# Patient Record
Sex: Male | Born: 1935 | Race: White | Hispanic: No | Smoking: Former smoker
Health system: Southern US, Community
[De-identification: ages and names within clinical notes are randomized; demographics above are authoritative.]

## PROBLEM LIST (undated history)

## (undated) DIAGNOSIS — G2581 Restless legs syndrome: Secondary | ICD-10-CM

## (undated) DIAGNOSIS — R413 Other amnesia: Secondary | ICD-10-CM

## (undated) DIAGNOSIS — E538 Deficiency of other specified B group vitamins: Secondary | ICD-10-CM

## (undated) DIAGNOSIS — D692 Other nonthrombocytopenic purpura: Secondary | ICD-10-CM

## (undated) DIAGNOSIS — E785 Hyperlipidemia, unspecified: Secondary | ICD-10-CM

## (undated) DIAGNOSIS — F419 Anxiety disorder, unspecified: Secondary | ICD-10-CM

## (undated) DIAGNOSIS — I10 Essential (primary) hypertension: Secondary | ICD-10-CM

## (undated) DIAGNOSIS — M1990 Unspecified osteoarthritis, unspecified site: Secondary | ICD-10-CM

## (undated) DIAGNOSIS — N400 Enlarged prostate without lower urinary tract symptoms: Secondary | ICD-10-CM

## (undated) HISTORY — PX: PROSTATE BIOPSY: SHX241

## (undated) HISTORY — DX: Hyperlipidemia, unspecified: E78.5

## (undated) HISTORY — DX: Benign prostatic hyperplasia without lower urinary tract symptoms: N40.0

## (undated) HISTORY — DX: Essential (primary) hypertension: I10

## (undated) HISTORY — DX: Restless legs syndrome: G25.81

## (undated) HISTORY — DX: Other nonthrombocytopenic purpura: D69.2

## (undated) HISTORY — DX: Other amnesia: R41.3

## (undated) HISTORY — DX: Anxiety disorder, unspecified: F41.9

## (undated) HISTORY — DX: Unspecified osteoarthritis, unspecified site: M19.90

## (undated) HISTORY — DX: Deficiency of other specified B group vitamins: E53.8

---

## 2011-04-15 DIAGNOSIS — H35729 Serous detachment of retinal pigment epithelium, unspecified eye: Secondary | ICD-10-CM | POA: Diagnosis not present

## 2011-04-15 DIAGNOSIS — H26499 Other secondary cataract, unspecified eye: Secondary | ICD-10-CM | POA: Diagnosis not present

## 2011-04-15 DIAGNOSIS — H353190 Nonexudative age-related macular degeneration, unspecified eye, stage unspecified: Secondary | ICD-10-CM | POA: Diagnosis not present

## 2011-05-06 DIAGNOSIS — E785 Hyperlipidemia, unspecified: Secondary | ICD-10-CM | POA: Diagnosis not present

## 2011-05-06 DIAGNOSIS — G2581 Restless legs syndrome: Secondary | ICD-10-CM | POA: Diagnosis not present

## 2011-05-06 DIAGNOSIS — M1990 Unspecified osteoarthritis, unspecified site: Secondary | ICD-10-CM | POA: Diagnosis not present

## 2011-05-06 DIAGNOSIS — I10 Essential (primary) hypertension: Secondary | ICD-10-CM | POA: Diagnosis not present

## 2011-11-03 DIAGNOSIS — Z125 Encounter for screening for malignant neoplasm of prostate: Secondary | ICD-10-CM | POA: Diagnosis not present

## 2011-11-03 DIAGNOSIS — E785 Hyperlipidemia, unspecified: Secondary | ICD-10-CM | POA: Diagnosis not present

## 2011-11-03 DIAGNOSIS — I10 Essential (primary) hypertension: Secondary | ICD-10-CM | POA: Diagnosis not present

## 2011-11-11 DIAGNOSIS — E785 Hyperlipidemia, unspecified: Secondary | ICD-10-CM | POA: Diagnosis not present

## 2011-11-11 DIAGNOSIS — I10 Essential (primary) hypertension: Secondary | ICD-10-CM | POA: Diagnosis not present

## 2011-11-11 DIAGNOSIS — M1990 Unspecified osteoarthritis, unspecified site: Secondary | ICD-10-CM | POA: Diagnosis not present

## 2011-11-11 DIAGNOSIS — Z125 Encounter for screening for malignant neoplasm of prostate: Secondary | ICD-10-CM | POA: Diagnosis not present

## 2011-11-11 DIAGNOSIS — Z0000 Encounter for general adult medical examination without abnormal findings: Secondary | ICD-10-CM | POA: Diagnosis not present

## 2012-05-11 DIAGNOSIS — Z1331 Encounter for screening for depression: Secondary | ICD-10-CM | POA: Diagnosis not present

## 2012-05-11 DIAGNOSIS — N401 Enlarged prostate with lower urinary tract symptoms: Secondary | ICD-10-CM | POA: Diagnosis not present

## 2012-05-11 DIAGNOSIS — E785 Hyperlipidemia, unspecified: Secondary | ICD-10-CM | POA: Diagnosis not present

## 2012-05-11 DIAGNOSIS — I10 Essential (primary) hypertension: Secondary | ICD-10-CM | POA: Diagnosis not present

## 2012-06-08 DIAGNOSIS — L570 Actinic keratosis: Secondary | ICD-10-CM | POA: Diagnosis not present

## 2012-11-09 DIAGNOSIS — Z125 Encounter for screening for malignant neoplasm of prostate: Secondary | ICD-10-CM | POA: Diagnosis not present

## 2012-11-09 DIAGNOSIS — I10 Essential (primary) hypertension: Secondary | ICD-10-CM | POA: Diagnosis not present

## 2012-11-09 DIAGNOSIS — E785 Hyperlipidemia, unspecified: Secondary | ICD-10-CM | POA: Diagnosis not present

## 2012-11-25 DIAGNOSIS — Z961 Presence of intraocular lens: Secondary | ICD-10-CM | POA: Diagnosis not present

## 2012-11-25 DIAGNOSIS — H353190 Nonexudative age-related macular degeneration, unspecified eye, stage unspecified: Secondary | ICD-10-CM | POA: Diagnosis not present

## 2012-11-29 DIAGNOSIS — I10 Essential (primary) hypertension: Secondary | ICD-10-CM | POA: Diagnosis not present

## 2012-11-29 DIAGNOSIS — E785 Hyperlipidemia, unspecified: Secondary | ICD-10-CM | POA: Diagnosis not present

## 2012-11-29 DIAGNOSIS — Z125 Encounter for screening for malignant neoplasm of prostate: Secondary | ICD-10-CM | POA: Diagnosis not present

## 2012-11-29 DIAGNOSIS — M1990 Unspecified osteoarthritis, unspecified site: Secondary | ICD-10-CM | POA: Diagnosis not present

## 2012-11-29 DIAGNOSIS — Z0000 Encounter for general adult medical examination without abnormal findings: Secondary | ICD-10-CM | POA: Diagnosis not present

## 2013-05-31 DIAGNOSIS — E785 Hyperlipidemia, unspecified: Secondary | ICD-10-CM | POA: Diagnosis not present

## 2013-05-31 DIAGNOSIS — M1990 Unspecified osteoarthritis, unspecified site: Secondary | ICD-10-CM | POA: Diagnosis not present

## 2013-05-31 DIAGNOSIS — I10 Essential (primary) hypertension: Secondary | ICD-10-CM | POA: Diagnosis not present

## 2013-05-31 DIAGNOSIS — IMO0002 Reserved for concepts with insufficient information to code with codable children: Secondary | ICD-10-CM | POA: Diagnosis not present

## 2013-06-20 DIAGNOSIS — M79609 Pain in unspecified limb: Secondary | ICD-10-CM | POA: Diagnosis not present

## 2013-11-24 DIAGNOSIS — Z125 Encounter for screening for malignant neoplasm of prostate: Secondary | ICD-10-CM | POA: Diagnosis not present

## 2013-11-24 DIAGNOSIS — I10 Essential (primary) hypertension: Secondary | ICD-10-CM | POA: Diagnosis not present

## 2013-11-24 DIAGNOSIS — E785 Hyperlipidemia, unspecified: Secondary | ICD-10-CM | POA: Diagnosis not present

## 2013-12-01 DIAGNOSIS — I10 Essential (primary) hypertension: Secondary | ICD-10-CM | POA: Diagnosis not present

## 2013-12-01 DIAGNOSIS — IMO0002 Reserved for concepts with insufficient information to code with codable children: Secondary | ICD-10-CM | POA: Diagnosis not present

## 2013-12-01 DIAGNOSIS — Z0000 Encounter for general adult medical examination without abnormal findings: Secondary | ICD-10-CM | POA: Diagnosis not present

## 2013-12-01 DIAGNOSIS — Z1331 Encounter for screening for depression: Secondary | ICD-10-CM | POA: Diagnosis not present

## 2013-12-01 DIAGNOSIS — E785 Hyperlipidemia, unspecified: Secondary | ICD-10-CM | POA: Diagnosis not present

## 2013-12-01 DIAGNOSIS — Z125 Encounter for screening for malignant neoplasm of prostate: Secondary | ICD-10-CM | POA: Diagnosis not present

## 2013-12-01 DIAGNOSIS — N401 Enlarged prostate with lower urinary tract symptoms: Secondary | ICD-10-CM | POA: Diagnosis not present

## 2013-12-01 DIAGNOSIS — M1990 Unspecified osteoarthritis, unspecified site: Secondary | ICD-10-CM | POA: Diagnosis not present

## 2013-12-12 DIAGNOSIS — Z961 Presence of intraocular lens: Secondary | ICD-10-CM | POA: Diagnosis not present

## 2013-12-12 DIAGNOSIS — H3531 Nonexudative age-related macular degeneration: Secondary | ICD-10-CM | POA: Diagnosis not present

## 2015-06-17 ENCOUNTER — Other Ambulatory Visit: Payer: Self-pay | Admitting: Internal Medicine

## 2015-06-17 DIAGNOSIS — R413 Other amnesia: Secondary | ICD-10-CM

## 2015-06-24 ENCOUNTER — Ambulatory Visit
Admission: RE | Admit: 2015-06-24 | Discharge: 2015-06-24 | Disposition: A | Discharge status: Home / Self Care | Payer: Medicare Other | Source: Ambulatory Visit | Attending: Internal Medicine | Admitting: Internal Medicine

## 2015-06-24 DIAGNOSIS — R413 Other amnesia: Secondary | ICD-10-CM

## 2015-06-24 MED ORDER — GADOBENATE DIMEGLUMINE 529 MG/ML IV SOLN
15.0000 mL | Freq: Once | INTRAVENOUS | Status: AC | PRN
  Administered 2015-06-24: 15 mL via INTRAVENOUS

## 2016-06-17 ENCOUNTER — Ambulatory Visit: Payer: Medicare Other | Admitting: Neurology

## 2016-06-17 ENCOUNTER — Encounter: Payer: Self-pay | Admitting: *Deleted

## 2016-06-17 ENCOUNTER — Encounter: Payer: Self-pay | Admitting: Diagnostic Neuroimaging

## 2016-06-17 ENCOUNTER — Encounter (INDEPENDENT_AMBULATORY_CARE_PROVIDER_SITE_OTHER): Payer: Self-pay

## 2016-06-17 ENCOUNTER — Ambulatory Visit (INDEPENDENT_AMBULATORY_CARE_PROVIDER_SITE_OTHER): Payer: Medicare Other | Admitting: Diagnostic Neuroimaging

## 2016-06-17 VITALS — BP 151/63 | HR 67 | Ht 69.0 in | Wt 147.4 lb

## 2016-06-17 DIAGNOSIS — F0391 Unspecified dementia with behavioral disturbance: Secondary | ICD-10-CM

## 2016-06-17 DIAGNOSIS — F03B18 Unspecified dementia, moderate, with other behavioral disturbance: Secondary | ICD-10-CM

## 2016-06-17 NOTE — Patient Instructions (Signed)
-   patient has moderate dementia and severe hearing loss  - I recommend that patient does not live alone or drive a vehicle at this time  - remove weapons and firearms from the home  - options include assisted living facility vs in home caregivers  - once patient has improved safety and supervision, may consider SSRI, mirtazapine, anti-psychotic meds in future  - advised patient and son to review legal issues re: POA, competency, guardianship; patient does not appear capable of handling his own finances at this time (he immediately forgets conversations, accidentally tries to open new bank accounts, accusatory towards son and family)

## 2016-06-17 NOTE — Progress Notes (Signed)
[facility]  PATIENT: Johnny May DOB: Nov 26, 1923  REFERRING CLINICIAN: Deirdre May HISTORY FROM: patient and son REASON FOR VISIT: new consult    HISTORICAL  CHIEF COMPLAINT:  Chief Complaint  Patient presents with  . NP Johnny May    Pt very [place]  . Memory Loss    MMSE 18/30 aft 9/30sec    HISTORY OF PRESENT ILLNESS:   83 year old male here for evaluation of dementia.  Patient is extremely hard of hearing. He does acknowledge some mild memory problems but does not think these are significant. Patient is living alone, ever since his wife Johnny May (married for 15 years) passed away on 2016/04/04. Since that time patient son has noted that he is having issues with short-term memory loss, mood swings, loneliness, safety concerns. Apparently his wife was concerned about these issues before she passed away.  Now patient having significant progression of short-term memory problems. He called the police 3 weeks ago stating that someone had broken into the house, when there is no evidence of this. Patient continues to have problems with paranoia stating that "strange people are putting dresses into the closet". Patient having trouble asking the same questions about his bank accounts, keys, wallet. Last week patient son was out of town traveling and patient's sister who is 59 years old came to stay with him. At one point patient did not recognize his sister, became angry with her and threatened her. Patient having concerns about management of his finances and money, which he has delegated to his son. When they're together patient is calm and rational and agrees with the management. However at various times throughout the day or night he may get confused and called his son and accused him that his money is not being managed properly.  Last night patient called up his son late at night asking where his wife Johnny May had gone, and he did not remember that she had passed away just a few  months ago.  Patient son is trying to get in-home care as soon as possible and hopefully this will be set up within the next 1 week. He is also looking at options for assisted living placement.  Patient son is concerned about patient's ability to manage his accounts properly. Apparently patient has gone to the bank several times and tried to open new accounts.  Patient son is concerned about patient's ability to drive. Apparently patient is still driving, and one day recently tried to come home but ended up parking in the driveway of a neighbor 3 houses away.  Patient has several firearms at home which patient son is planning to remove and secure.  Patient having inverted schedule, getting very confused in the evening, staying up late at night, calling son multiple times during the nighttime.    REVIEW OF SYSTEMS: Full 14 system review of systems performed and negative with exception of: Fevers chills weight loss hearing loss memory loss confusion and slurred speech dizziness restless legs depression anxiety hallucinations not asleep.  ALLERGIES: Not on File  HOME MEDICATIONS: Outpatient Medications Prior to Visit  Medication Sig Dispense Refill  . aspirin EC 81 MG tablet Take 162 mg by mouth every other day.    . donepezil (ARICEPT) 5 MG tablet Take 5 mg by mouth at bedtime.     No facility-administered medications prior to visit.     PAST MEDICAL HISTORY: Past Medical History:  Diagnosis Date  . Anxiety   . BPH (benign prostatic hyperplasia)   .  Hyperlipidemia   . Hypertension   . Memory loss   . Osteoarthritis   . RLS (restless legs syndrome)   . Senile purpura ([place])   . Vitamin B12 deficiency     PAST SURGICAL HISTORY: Past Surgical History:  Procedure Laterality Date  . PROSTATE BIOPSY      FAMILY HISTORY: Family History  Problem Relation Age of Onset  . Heart disease Mother   . Heart disease Father     SOCIAL HISTORY:  Social History   Social History    . Marital status: Married    Spouse name: N/A  . Number of children: N/A  . Years of education: N/A   Occupational History  . Not on file.   Social History Main Topics  . Smoking status: Former Smoker    Packs/day: 1.00    Quit date: 03/09/1966  . Smokeless tobacco: Never Used     Comment: 50 years ago quit  . Alcohol use No  . Drug use: No  . Sexual activity: Not on file   Other Topics Concern  . Not on file   Social History Narrative   Pt lives at home alone. Widow as of 2018.  One son. Retired.     Caffeine 1 drink daily.  Is driving.       PHYSICAL EXAM  GENERAL EXAM/CONSTITUTIONAL: Vitals:  Vitals:   06/17/16 1517  BP: (!) 151/63  Pulse: 67  Weight: 147 lb 6.4 oz (66.9 kg)  Height: 5\' 9"  (1.753 m)     Body mass index is 21.77 kg/m.  No exam data present  Patient is in no distress; well developed, nourished and groomed; neck is supple  CARDIOVASCULAR:  Examination of carotid arteries is normal; no carotid bruits  Regular rate and rhythm, no murmurs  Examination of peripheral vascular system by observation and palpation is normal  EYES:  Ophthalmoscopic exam of optic discs and posterior segments is normal; no papilledema or hemorrhages  MUSCULOSKELETAL:  Gait, strength, tone, movements noted in Neurologic exam below  NEUROLOGIC: MENTAL STATUS:  MMSE - Mini Mental State Exam 06/17/2016  Orientation to time 0  Orientation to Place 3  Registration 3  Attention/ Calculation 4  Recall 1  Language- name 2 objects 2  Language- repeat 1  Language- follow 3 step command 2  Language- read & follow direction 1  Write a sentence 1  Copy design 0  Total score 18    awake, alert, oriented to person, place and time  [place]; comprehension intact, naming intact,   fund of knowledge appropriate  DECREASED INSIGHT  CRANIAL NERVE:   2nd - no papilledema on fundoscopic exam  2nd, 3rd, 4th,  6th - pupils equal and reactive to light, visual fields full to confrontation, extraocular muscles intact, no nystagmus  5th - facial sensation symmetric  7th - facial strength symmetric  8th - hearing --> MOD - SEVERE HEARING LOSS    9th - palate elevates symmetrically, uvula midline  11th - shoulder shrug symmetric  12th - tongue protrusion midline  MOTOR:   normal bulk and tone, full strength in the BUE, BLE  SENSORY:   normal and symmetric to light touch  COORDINATION:   finger-nose-finger, fine finger movements normal  REFLEXES:   deep tendon reflexes TRACE and symmetric  GAIT/STATION:   narrow based gait    DIAGNOSTIC DATA (LABS, IMAGING, TESTING) - I reviewed patient records, labs, notes, testing and imaging myself  where available.  No results found for: WBC, HGB, HCT, MCV, PLT No results found for: NA, K, CL, CO2, GLUCOSE, BUN, CREATININE, CALCIUM, PROT, ALBUMIN, AST, ALT, ALKPHOS, BILITOT, GFRNONAA, GFRAA No results found for: CHOL, HDL, LDLCALC, LDLDIRECT, TRIG, CHOLHDL No results found for: HGBA1C No results found for: VITAMINB12 No results found for: TSH   06/24/15 MRI brain [I reviewed images myself and agree with interpretation. -VRP]  1. No acute intracranial abnormality. 2. Largely unremarkable for age MRI appearance of the brain; mild nonspecific cerebral white matter changes which are most commonly due to chronic small vessel disease.     ASSESSMENT AND PLAN  83 y.o. year old male here with progressive short-term memory loss, confusion, navigation difficulty, poor judgment and insight, agitation, confusion, hallucinations, paranoid thoughts, worsening over the last 3 months, but possibly gradually progressing for at least one year. Signs and symptoms are most consistent with moderate - severe neurodegenerative dementia.  Dx:  1. Moderate dementia with behavioral disturbance      PLAN:  I spent 60 minutes of face to face time with  patient. Greater than 50% of time was spent in counseling and coordination of care with patient. In summary we discussed:    - patient has moderate -severe dementia and severe hearing loss  - I recommend that patient does not live alone or drive at this time (son agrees)  - son must remove weapons and firearms from the home immediately (son agrees)  - options include assisted living facility vs in home caregivers  - once patient has improved safety and supervision, may consider SSRI, mirtazapine, anti-psychotic meds in future for sleep and behavior issues  - advised patient and son to review legal issues re: POA, competency, guardianship; patient does not appear capable of handling his own finances at this time (he immediately forgets conversations, accidentally tries to open new bank accounts, accusatory towards son and family)  Return in about 3 months (around 09/16/2016).    Penni Bombard, MD 04/20/9415, 4:08 PM Certified in Neurology, Neurophysiology and Neuroimaging  [facility] [address] [place], [place] [address] [phone]

## 2016-06-18 DIAGNOSIS — F0391 Unspecified dementia with behavioral disturbance: Secondary | ICD-10-CM | POA: Insufficient documentation

## 2016-06-18 DIAGNOSIS — F03B18 Unspecified dementia, moderate, with other behavioral disturbance: Secondary | ICD-10-CM | POA: Insufficient documentation

## 2016-06-24 ENCOUNTER — Telehealth: Payer: Self-pay | Admitting: Diagnostic Neuroimaging

## 2016-06-24 NOTE — Telephone Encounter (Signed)
Pt son called to advise Dr Leta Baptist of increasing concerns for his father. Pt son said his father's behavior is increasingly troubling and very alarming. He is angry and insist on living alone and refusing in home care assistance. Pt blacked out in church and still insisted on driving home. Pt Son said Dr Leta Baptist recommended a physc evaluation, pt son would like to move forward with that suggestion and would like a call with a referral.  Pt son also said that he is getting some paperwork from the [facility] for his father and will need it filled out by Dr Leta Baptist. Pt son would like to be called at [phone].

## 2016-06-24 NOTE — Telephone Encounter (Signed)
I called patient's son x 2; no answer. -VRP

## 2016-06-25 NOTE — Telephone Encounter (Signed)
Patient son Johnny May called office returning call to Dr. Leta Baptist please contact Johnny May at [phone] and please leave a voice mail per Johnny May if he does not answer the call.

## 2016-06-29 ENCOUNTER — Encounter: Payer: Self-pay | Admitting: *Deleted

## 2016-06-29 ENCOUNTER — Telehealth: Payer: Self-pay | Admitting: Diagnostic Neuroimaging

## 2016-06-29 NOTE — Telephone Encounter (Signed)
Spoke to son, [place], [place].  Stated that pt will be going to [facility] on [address].  Needs letter for [facility], attention Desiree Hane faxed to [phone].  He is to come by with POA and also sign release of information for [place] to release medical information to [facility].  Placed up front in file.

## 2016-06-29 NOTE — Telephone Encounter (Signed)
LMVM for pts son, relating to letter for [facility] ( pt not to drive).

## 2016-06-29 NOTE — Telephone Encounter (Signed)
Patients son Kasandra Knudsen called office requesting a letter be written for him to bring to the [facility] stating patient is unable to drive.  Please call Danny to confirm where letter will need to be sent to. Please send to Desiree Hane email: [email or web address].  Please contact Danny prior to sending letter.

## 2016-06-29 NOTE — Telephone Encounter (Signed)
Patients son returning RN's call.  Please call

## 2016-06-30 NOTE — Telephone Encounter (Addendum)
LMVM for son of pt, who is POA.  Dr. Leta Baptist does not do FL-2 forms defers to pcp.  I have not received fax but if do will forward to pcp, Dr. [name].  On medication for anxiety, will hold off until pt at facility and see how he adjusts.

## 2016-06-30 NOTE — Telephone Encounter (Signed)
Son called in to inform that he is planning to have pt moved into [facility] on [place] of this week (26th).  Mel Almond @ [address] is sending a fax to [place] with the [facility] form, son is asking this to be filled out asap.  Pt son also mentioned that Dr Leta Baptist mentioned he would not want to put pt on anxiety medication until he is in 24 hour care.  Pt son said with the move he'd like to know if Dr Leta Baptist would get with staff to coordinate something.  Son can be reached at [phone]

## 2016-07-01 ENCOUNTER — Emergency Department (HOSPITAL_COMMUNITY)
Admission: EM | Admit: 2016-07-01 | Discharge: 2016-07-03 | Disposition: A | Discharge status: Home / Self Care | Payer: Medicare Other | Attending: Emergency Medicine | Admitting: Emergency Medicine

## 2016-07-01 ENCOUNTER — Encounter (HOSPITAL_COMMUNITY): Payer: Self-pay | Admitting: Emergency Medicine

## 2016-07-01 DIAGNOSIS — Z7982 Long term (current) use of aspirin: Secondary | ICD-10-CM | POA: Diagnosis not present

## 2016-07-01 DIAGNOSIS — F03B18 Unspecified dementia, moderate, with other behavioral disturbance: Secondary | ICD-10-CM

## 2016-07-01 DIAGNOSIS — R4689 Other symptoms and signs involving appearance and behavior: Secondary | ICD-10-CM

## 2016-07-01 DIAGNOSIS — Z79899 Other long term (current) drug therapy: Secondary | ICD-10-CM | POA: Diagnosis not present

## 2016-07-01 DIAGNOSIS — Z87891 Personal history of nicotine dependence: Secondary | ICD-10-CM | POA: Diagnosis not present

## 2016-07-01 DIAGNOSIS — F0391 Unspecified dementia with behavioral disturbance: Secondary | ICD-10-CM | POA: Diagnosis not present

## 2016-07-01 DIAGNOSIS — Z01818 Encounter for other preprocedural examination: Secondary | ICD-10-CM

## 2016-07-01 DIAGNOSIS — I10 Essential (primary) hypertension: Secondary | ICD-10-CM | POA: Insufficient documentation

## 2016-07-01 DIAGNOSIS — F918 Other conduct disorders: Secondary | ICD-10-CM | POA: Diagnosis present

## 2016-07-01 NOTE — ED Triage Notes (Signed)
Pt brought in by police voluntarily for medical clearance  Pt is currently staying with his sister until they can get him in an assisted living facility on Friday   Family states pt got aggressive and was threatening his family earlier tonight  Pt has dementia

## 2016-07-01 NOTE — ED Provider Notes (Signed)
[facility] DEPT Provider Note   CSN: [id number] Arrival date & time: 07/01/16  2259   By signing my name below, I, Eunice Blase, attest that this documentation has been prepared under the direction and in the presence of [employer], PA-C. Electronically signed, Eunice Blase, ED Scribe. 07/01/16. 12:19 AM.   History   Chief Complaint Chief Complaint  Patient presents with  . Medical Clearance   LEVEL 5 CAVEAT: HPI and ROS limited due to dementia   The history is provided by the patient and medical records. The history is limited by the condition of the patient. No language interpreter was used.    Johnny May is a 83 y.o. male with h/o dementia, BIB [employer] presents to the Emergency Department with concern for the pt's safety. Pt reportedly lives alone, but has been staying with his sister while transitioning into assisted living. His son has attempted to transition him to assisted living with increasing resistance recently. Pt reportedly has become more agitated. He has a known hx of dementia. Pt not on any medications for dementia. Son states the pt has become belligerent and threatening to his sister, with whom he has been living as the transition is finalized for the pt to be moved to assisted living. [facility]vers are prepared to move the pt to assisted living facility on 07/03/2016, per pt's son. Pain noted to the pt's R foot. He has taken aspirin for this with temporary relief.  Pt's son has power of attorney and is the pt's caregiver: [phone]   Past Medical History:  Diagnosis Date  . Anxiety   . BPH (benign prostatic hyperplasia)   . Hyperlipidemia   . Hypertension   . Memory loss   . Osteoarthritis   . RLS (restless legs syndrome)   . Senile purpura ([place])   . Vitamin B12 deficiency     Patient Active Problem List   Diagnosis Date Noted  . Moderate dementia with behavioral disturbance 06/18/2016    Past Surgical History:  Procedure Laterality Date  . PROSTATE  BIOPSY         Home Medications    Prior to Admission medications   Medication Sig Start Date End Date Taking? Authorizing Provider  aspirin EC 81 MG tablet Take 162 mg by mouth every other day.   Yes Historical Provider, MD  donepezil (ARICEPT) 5 MG tablet Take 5 mg by mouth at bedtime.   Yes Historical Provider, MD    Family History Family History  Problem Relation Age of Onset  . Heart disease Mother   . Heart disease Father     Social History Social History  Substance Use Topics  . Smoking status: Former Smoker    Packs/day: 1.00    Quit date: 03/09/1966  . Smokeless tobacco: Never Used     Comment: 50 years ago quit  . Alcohol use No     Allergies   Patient has no known allergies.   Review of Systems Review of Systems  Unable to perform ROS: Dementia     Physical Exam Updated Vital Signs BP (!) 153/103 (BP Location: Right Arm)   Pulse 79   Temp 97.5 F (36.4 C) (Oral)   Resp 18   SpO2 100%   Physical Exam  Constitutional: He is oriented to person, place, and time. He appears well-developed and well-nourished. No distress.  Nontoxic and in NAD  HENT:  Head: Normocephalic and atraumatic.  Eyes: Conjunctivae and EOM are normal. No scleral icterus.  Neck: Normal range  of motion.  Pulmonary/Chest: Effort normal. No respiratory distress.  Respirations even and unlabored  Musculoskeletal: Normal range of motion.  Neurological: He is alert and oriented to person, place, and time. He exhibits normal muscle tone. Coordination normal.  GCS 15. Patient answers questions appropriately and follows commands. Moving all extremities; ambulatory.  Skin: Skin is warm and dry. No rash noted. He is not diaphoretic. No erythema. No pallor.  Psychiatric: He has a normal mood and affect. His behavior is normal.  Nursing note and vitals reviewed.    ED Treatments / Results  DIAGNOSTIC STUDIES: Oxygen Saturation is 100% on RA, NL by my interpretation.     COORDINATION OF CARE: 12:11 AM-Discussed next steps with pt. Pt verbalized understanding and is agreeable with the plan. Will order psychiatric evaluation.   Labs (all labs ordered are listed, but only abnormal results are displayed) Labs Reviewed  COMPREHENSIVE METABOLIC PANEL - Abnormal; Notable for the following:       Result Value   Potassium 3.3 (*)    Calcium 8.7 (*)    ALT 13 (*)    All other components within normal limits  CBC - Abnormal; Notable for the following:    RBC 3.71 (*)    Hemoglobin 11.4 (*)    HCT 34.6 (*)    All other components within normal limits  ETHANOL  SALICYLATE LEVEL  ACETAMINOPHEN LEVEL  RAPID URINE DRUG SCREEN, HOSP PERFORMED    EKG  EKG Interpretation None       Radiology No results found.  Procedures Procedures (including critical care time)  Medications Ordered in ED Medications  zolpidem (AMBIEN) tablet 5 mg (5 mg Oral Given 07/02/16 0340)     Initial Impression / Assessment and Plan / ED Course  I have reviewed the triage vital signs and the nursing notes.  Pertinent labs & imaging results that were available during my care of the patient were reviewed by me and considered in my medical decision making (see chart for details).     Patient medically cleared. He has been evaluated by TTS who recommend psychiatric evaluation in the morning. Disposition to be determined by oncoming ED provider.   Final Clinical Impressions(s) / ED Diagnoses   Final diagnoses:  Aggressive behavior  Dementia with behavioral disturbance, unspecified dementia type    New Prescriptions New Prescriptions   No medications on file    I personally performed the services described in this documentation, which was scribed in my presence. The recorded information has been reviewed and is accurate.      Antonietta Breach, PA-C 07/02/16 6073    April Palumbo, MD 07/02/16 [phone]

## 2016-07-02 ENCOUNTER — Emergency Department (HOSPITAL_COMMUNITY): Payer: Medicare Other

## 2016-07-02 DIAGNOSIS — Z87891 Personal history of nicotine dependence: Secondary | ICD-10-CM

## 2016-07-02 DIAGNOSIS — F0391 Unspecified dementia with behavioral disturbance: Secondary | ICD-10-CM | POA: Diagnosis not present

## 2016-07-02 LAB — URINALYSIS, ROUTINE W REFLEX MICROSCOPIC
BACTERIA UA: NONE SEEN
BILIRUBIN URINE: NEGATIVE
Glucose, UA: NEGATIVE mg/dL
KETONES UR: NEGATIVE mg/dL
LEUKOCYTES UA: NEGATIVE
NITRITE: NEGATIVE
PH: 6 (ref 5.0–8.0)
Protein, ur: NEGATIVE mg/dL
Specific Gravity, Urine: 1.009 (ref 1.005–1.030)
Squamous Epithelial / LPF: NONE SEEN
WBC UA: NONE SEEN WBC/hpf (ref 0–5)

## 2016-07-02 LAB — COMPREHENSIVE METABOLIC PANEL
ALBUMIN: 3.7 g/dL (ref 3.5–5.0)
ALK PHOS: 54 U/L (ref 38–126)
ALT: 13 U/L — ABNORMAL LOW (ref 17–63)
AST: 20 U/L (ref 15–41)
Anion gap: 7 (ref 5–15)
BUN: 16 mg/dL (ref 6–20)
CHLORIDE: 105 mmol/L (ref 101–111)
CO2: 28 mmol/L (ref 22–32)
Calcium: 8.7 mg/dL — ABNORMAL LOW (ref 8.9–10.3)
Creatinine, Ser: 1.05 mg/dL (ref 0.61–1.24)
GFR calc Af Amer: >60 mL/min (ref >60)
GFR calc non Af Amer: >60 mL/min (ref >60)
GLUCOSE: 96 mg/dL (ref 65–99)
POTASSIUM: 3.3 mmol/L — AB (ref 3.5–5.1)
SODIUM: 140 mmol/L (ref 135–145)
Total Bilirubin: 0.4 mg/dL (ref 0.3–1.2)
Total Protein: 6.8 g/dL (ref 6.5–8.1)

## 2016-07-02 LAB — CBC
HEMATOCRIT: 34.6 % — AB (ref 39.0–52.0)
HEMOGLOBIN: 11.4 g/dL — AB (ref 13.0–17.0)
MCH: 30.7 pg (ref 26.0–34.0)
MCHC: 32.9 g/dL (ref 30.0–36.0)
MCV: 93.3 fL (ref 78.0–100.0)
Platelets: 239 10*3/uL (ref 150–400)
RBC: 3.71 MIL/uL — AB (ref 4.22–5.81)
RDW: 14.1 % (ref 11.5–15.5)
WBC: 9 10*3/uL (ref 4.0–10.5)

## 2016-07-02 LAB — SALICYLATE LEVEL: Salicylate Lvl: <7 mg/dL (ref 2.8–30.0)

## 2016-07-02 LAB — ACETAMINOPHEN LEVEL: ACETAMINOPHEN (TYLENOL), SERUM: 12 ug/mL (ref 10–30)

## 2016-07-02 LAB — ETHANOL: Alcohol, Ethyl (B): <5 mg/dL (ref <5)

## 2016-07-02 IMAGING — CR DG CHEST 2V
2 series · 2 of 2 positions shown · non-contrast
Comparison: None.

CLINICAL DATA: Encounter for pre-admission testing

EXAM:
CHEST  2 VIEW

[w chest pa]
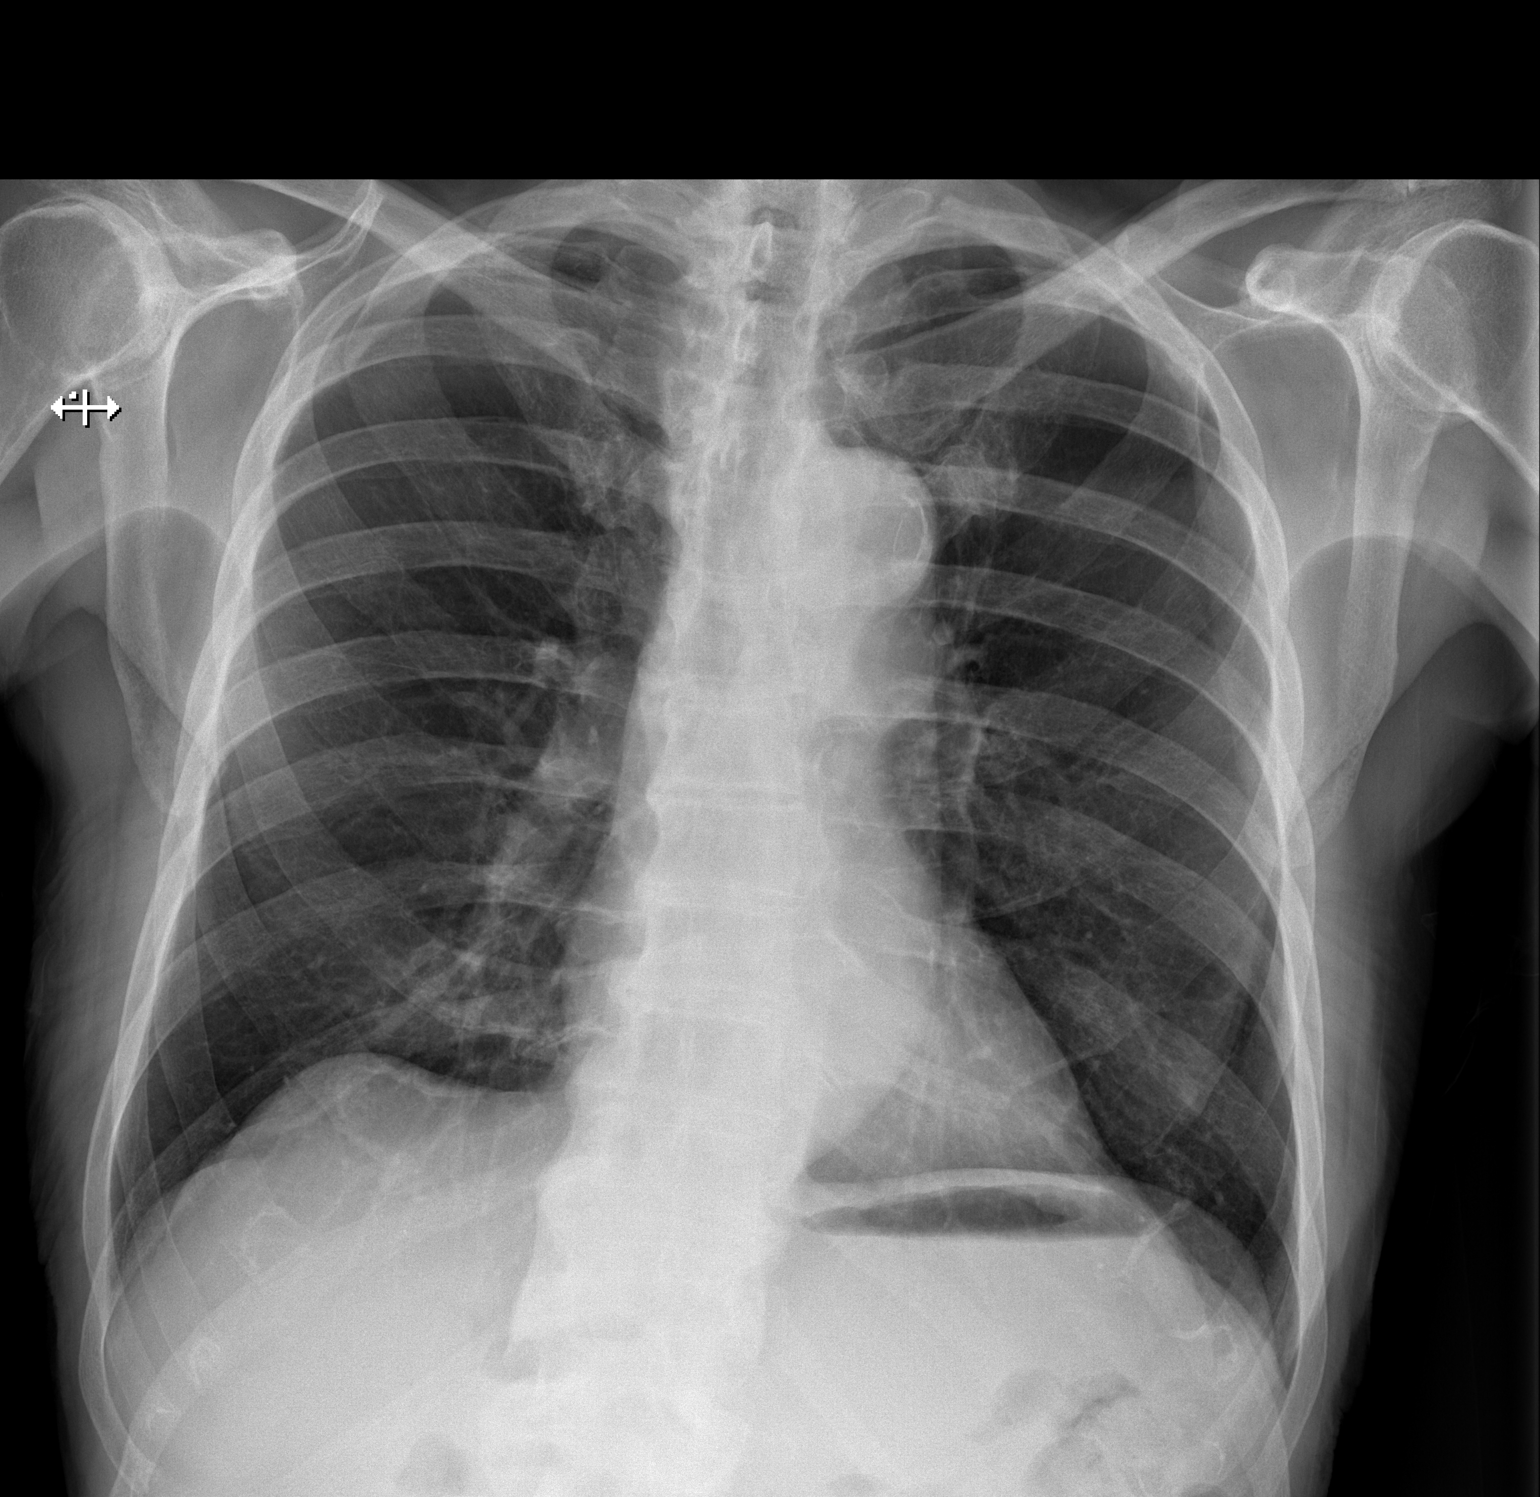

[w chest lat]
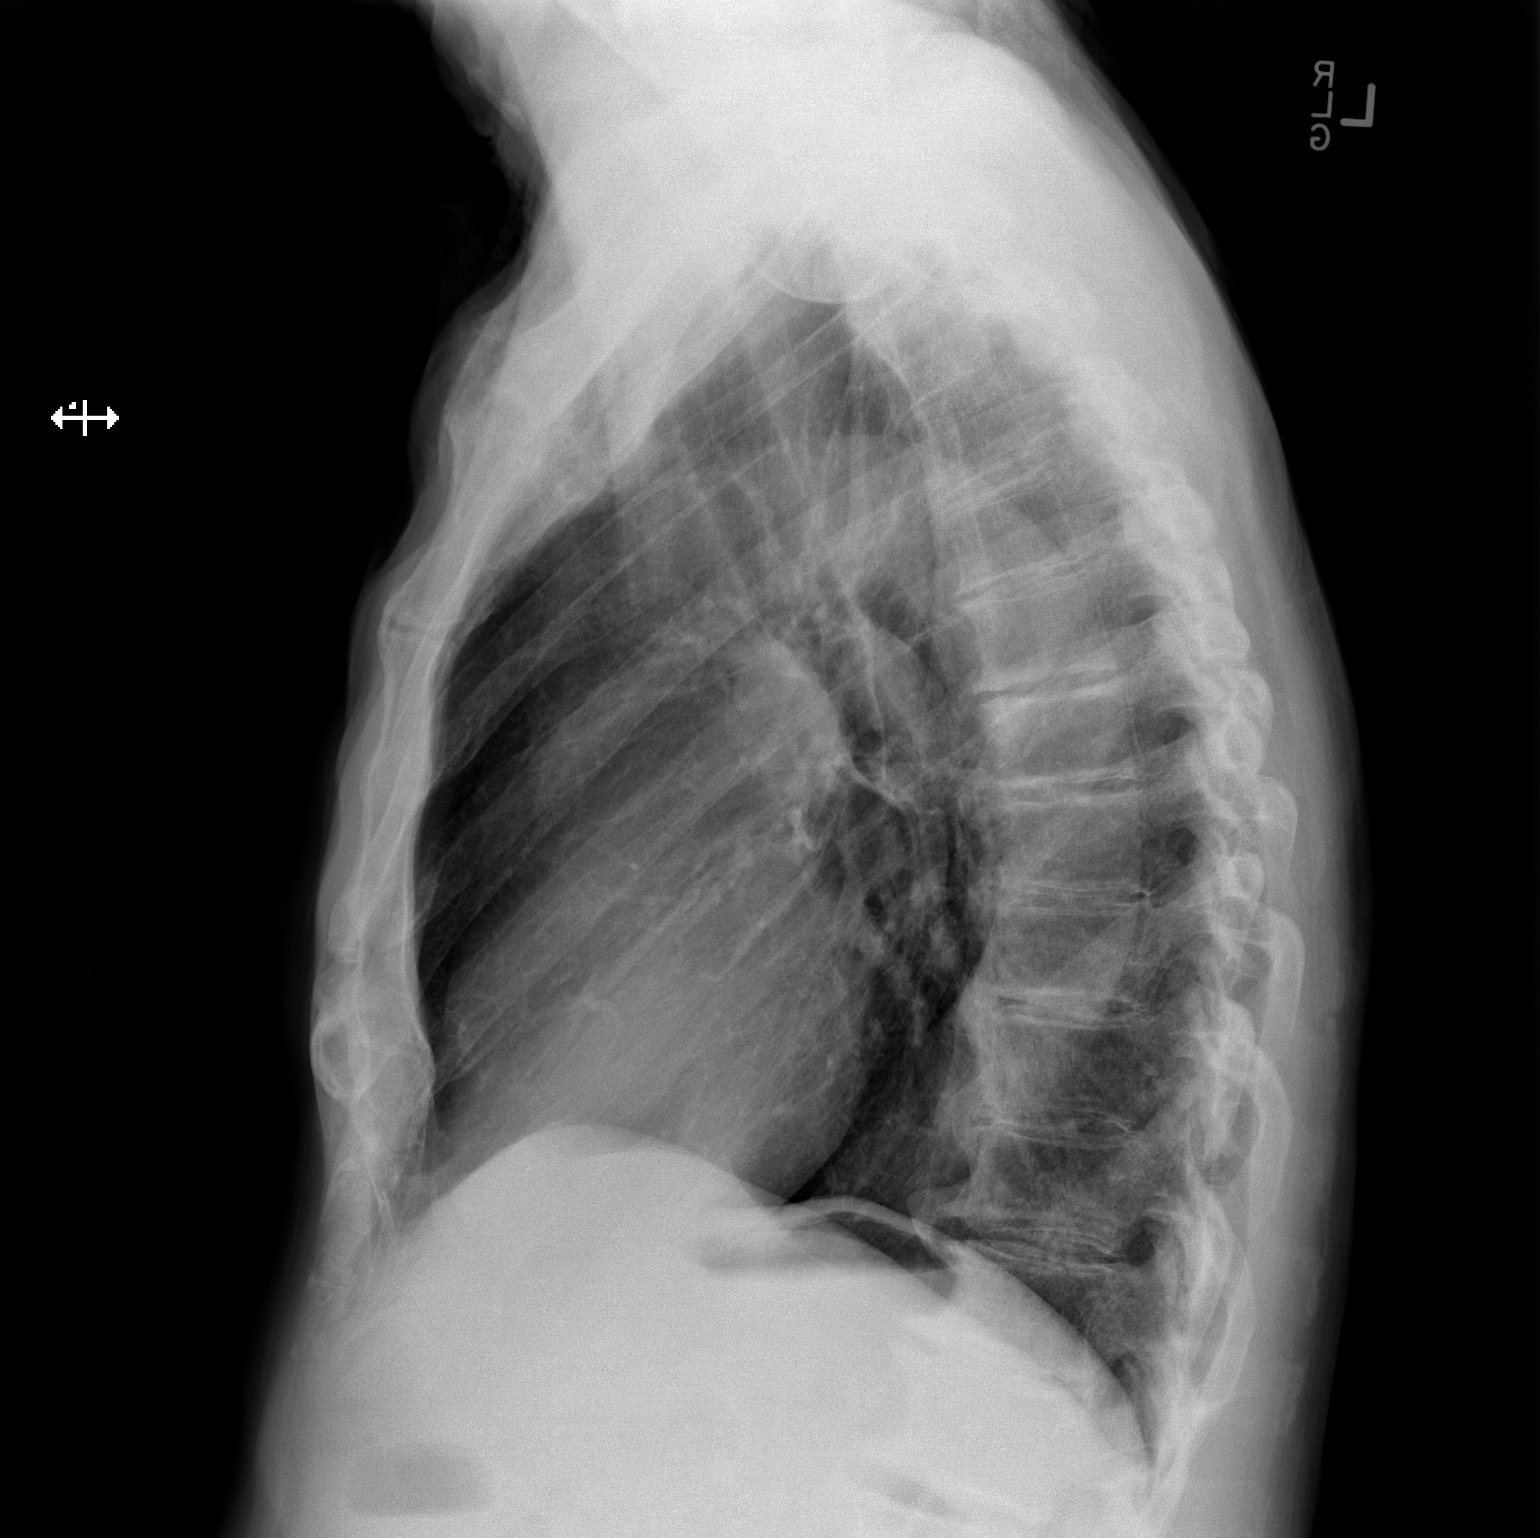

[2 of 2 positions shown; findings below may reference images not displayed]

FINDINGS: Heart and mediastinal contours are within normal limits. No focal
opacities or effusions. No acute bony abnormality.
IMPRESSION: No active cardiopulmonary disease.

## 2016-07-02 MED ORDER — QUETIAPINE FUMARATE 50 MG PO TABS
50.0000 mg | ORAL_TABLET | Freq: Every day | ORAL | Status: DC
  Administered 2016-07-02: 50 mg via ORAL
  Filled 2016-07-02: qty 1

## 2016-07-02 MED ORDER — DOXYCYCLINE HYCLATE 100 MG PO TABS
100.0000 mg | ORAL_TABLET | Freq: Two times a day (BID) | ORAL | Status: DC
  Administered 2016-07-02 – 2016-07-03 (×3): 100 mg via ORAL
  Filled 2016-07-02 (×3): qty 1

## 2016-07-02 MED ORDER — ASENAPINE MALEATE 5 MG SL SUBL
5.0000 mg | SUBLINGUAL_TABLET | Freq: Once | SUBLINGUAL | Status: AC
  Administered 2016-07-02: 5 mg via SUBLINGUAL
  Filled 2016-07-02: qty 1

## 2016-07-02 MED ORDER — ASPIRIN EC 81 MG PO TBEC
162.0000 mg | DELAYED_RELEASE_TABLET | ORAL | Status: DC
  Administered 2016-07-02: 162 mg via ORAL
  Filled 2016-07-02: qty 2

## 2016-07-02 MED ORDER — POTASSIUM CHLORIDE CRYS ER 20 MEQ PO TBCR
40.0000 meq | EXTENDED_RELEASE_TABLET | Freq: Once | ORAL | Status: AC
  Administered 2016-07-02: 40 meq via ORAL
  Filled 2016-07-02: qty 2

## 2016-07-02 MED ORDER — CITALOPRAM HYDROBROMIDE 10 MG PO TABS
10.0000 mg | ORAL_TABLET | Freq: Every day | ORAL | Status: DC
  Administered 2016-07-02 – 2016-07-03 (×2): 10 mg via ORAL
  Filled 2016-07-02 (×2): qty 1

## 2016-07-02 MED ORDER — DONEPEZIL HCL 5 MG PO TABS
5.0000 mg | ORAL_TABLET | Freq: Every day | ORAL | Status: DC
  Administered 2016-07-02: 5 mg via ORAL
  Filled 2016-07-02: qty 1

## 2016-07-02 MED ORDER — ZOLPIDEM TARTRATE 5 MG PO TABS
5.0000 mg | ORAL_TABLET | Freq: Every evening | ORAL | Status: DC | PRN
  Administered 2016-07-02: 5 mg via ORAL
  Filled 2016-07-02: qty 1

## 2016-07-02 NOTE — ED Notes (Signed)
Pt became agitated, requested his belongings and cell phone to call for a ride home. This RN reiterated that he will speak to a psychiatrist in the AM and would he like some med to help him sleep. Pt replyed "Yes". PA informed of need for sleep aid.

## 2016-07-02 NOTE — BH Assessment (Addendum)
Tele Assessment Note   Johnny May is an 83 y.o. male, who presents voluntary and unaccompanied to [facility]. Pt reported, "the little boy cried like a baby because I was mean to her." Pt reported, "I was mean to the little girl." Pt reported, the little girl was his 11 year-old, great niece. Pt reported, "I don't know what to do about the little girl." Pt reported, she concocted a story." Pt reported, "the law brought me to [facility]." Pt reported, "I wish I had my car, the lady doctor said I can leave in the morning." Pt denied SI, HI, AVH, and self-injurious behaviors.   Per EDP/NP/PA note: "Pt presents to the Emergency Department with concern for the pt's safety. Pt reportedly lives alone ut has been staying with his sister while transitioning into assisted living. His son has attempted to transition him to assisted living with increasing resistance recently. Pt reportedly has become more agitated since dementia diagnosis. Pt not on any medications for dementia. Son states the pt has become belligerent and threatening to his sister, with whom he has been living as the transition is finalized for the pt to be moved to assisted living. Pain noted to the pt's R foot. He has taken aspirin for this with temporary relief. [facility] are prepared to move the pt to assisted living facility on 07/03/2016, per pt's son."   Clinician was unable to assess history of abuse, previous inpatient admissions. Pt denied substance use. Per chart, pt's UDS is pending. Pt denies being linked to OPT resources (medication management and/or counseling).   Pt presented alert in scrubs, with loud logical/coherent speech. Pt's eye contact was good. Pt's mood was pleasant. Pt's affect was appropriate to circumstance. Pt's thought process was coherent/relevant. Pt's judgement was unimpaired. Pt's concentration was normal. Pt's insight was fair. Pt's impulse control was poor. Pt was oriented x4 (day, year, city and state).   Diagnosis:  Deferred  Past Medical History:  Past Medical History:  Diagnosis Date  . Anxiety   . BPH (benign prostatic hyperplasia)   . Hyperlipidemia   . Hypertension   . Memory loss   . Osteoarthritis   . RLS (restless legs syndrome)   . Senile purpura ([place])   . Vitamin B12 deficiency     Past Surgical History:  Procedure Laterality Date  . PROSTATE BIOPSY      Family History:  Family History  Problem Relation Age of Onset  . Heart disease Mother   . Heart disease Father     Social History:  reports that he quit smoking about 50 years ago. He smoked 1.00 pack per day. He has never used smokeless tobacco. He reports that he does not drink alcohol or use drugs.  Additional Social History:  Alcohol / Drug Use Pain Medications: See MAR Prescriptions: See MAR Over the Counter: See MAR  CIWA: CIWA-Ar BP: (!) 153/103 Pulse Rate: 79 COWS:    PATIENT STRENGTHS: (choose at least two) Average or above average intelligence Supportive family/friends  Allergies: No Known Allergies  Home Medications:  (Not in a hospital admission)  OB/GYN Status:  No LMP for male patient.  General Assessment Data Location of Assessment: [facility] TTS Assessment: In system Is this a Tele or Face-to-Face Assessment?: Face-to-Face Is this an Initial Assessment or a Re-assessment for this encounter?: Initial Assessment Marital status: Widowed Living Arrangements: Other (Comment) (Per chart, with sister. ) Can pt return to current living arrangement?:  (UTA) Admission Status: Voluntary Is patient capable of signing  voluntary admission?: Yes Referral Source: Self/Family/Friend Insurance type: [place] Living Arrangements: Other (Comment) (Per chart, with sister. ) Legal Guardian: Other: (Self) Name of Psychiatrist: Na Name of Therapist: NA  Education Status Is patient currently in school?: No Current Grade: NA Highest grade of school patient has completed: High School Name of  school: NA Contact person: NA  Risk to self with the past 6 months Suicidal Ideation: No (Pt denies. ) Has patient been a risk to self within the past 6 months prior to admission? : No Suicidal Intent: No Has patient had any suicidal intent within the past 6 months prior to admission? : No Is patient at risk for suicide?: No Suicidal Plan?: No Has patient had any suicidal plan within the past 6 months prior to admission? : No Access to Means: No What has been your use of drugs/alcohol within the last 12 months?: Pending Previous Attempts/Gestures: No How many times?: 0 Other Self Harm Risks: Pt denies. Triggers for Past Attempts: None known Intentional Self Injurious Behavior: None (Pt denies. ) Family Suicide History: Unable to assess Recent stressful life event(s): Other (Comment) (UTA) Persecutory voices/beliefs?: No Depression: No Substance abuse history and/or treatment for substance abuse?: No Suicide prevention information given to non-admitted patients: Not applicable  Risk to Others within the past 6 months Homicidal Ideation: No (Pt denies. ) Does patient have any lifetime risk of violence toward others beyond the six months prior to admission? : No Thoughts of Harm to Others: No Current Homicidal Intent: No Current Homicidal Plan: No Access to Homicidal Means: No Identified Victim: NA History of harm to others?: No Assessment of Violence: On admission Violent Behavior Description: Per chart, pt has became beligerent and threatening sister.  Does patient have access to weapons?: No (shot gun for hunting) Criminal Charges Pending?: No Does patient have a court date: No Is patient on probation?: No  Psychosis Hallucinations: None noted Delusions: None noted  Mental Status Report Appearance/Hygiene: In scrubs Eye Contact: Good Motor Activity: Unremarkable Speech: Logical/coherent Level of Consciousness: Alert Mood: Pleasant Affect: Appropriate to  circumstance Anxiety Level: None Thought Processes: Coherent, Relevant Judgement: Unimpaired Orientation: Other (Comment) (day, year, city and state.) Obsessive Compulsive Thoughts/Behaviors: None  Cognitive Functioning Concentration: Normal Memory: Recent Intact IQ: Average Insight: Fair Impulse Control: Poor Appetite: Good Weight Loss: 0 Weight Gain: 0 Sleep: No Change Total Hours of Sleep:  (Pt reported, good. ) Vegetative Symptoms: None  ADLScreening [facility] Assessment Services) Patient's cognitive ability adequate to safely complete daily activities?: Yes Patient able to express need for assistance with ADLs?: Yes Independently performs ADLs?: Yes (appropriate for developmental age)  Prior Inpatient Therapy Prior Inpatient Therapy: No Prior Therapy Dates: NA Prior Therapy Facilty/Provider(s): NA Reason for Treatment: NA  Prior Outpatient Therapy Prior Outpatient Therapy: No Prior Therapy Dates: NA Prior Therapy Facilty/Provider(s): NA Reason for Treatment: NA Does patient have an ACCT team?: No Does patient have Intensive In-House Services?  : No Does patient have [facility] services? : No Does patient have P4CC services?: No  ADL Screening (condition at time of admission) Patient's cognitive ability adequate to safely complete daily activities?: Yes Is the patient deaf or have difficulty hearing?: Yes (Pt wears a hearing aid. ) Does the patient have difficulty seeing, even when wearing glasses/contacts?: Yes (Pt wears glasses. ) Does the patient have difficulty concentrating, remembering, or making decisions?: No Patient able to express need for assistance with ADLs?: Yes Does the patient have difficulty dressing  or bathing?:  (UTA) Independently performs ADLs?: Yes (appropriate for developmental age) Does the patient have difficulty walking or climbing stairs?: No Weakness of Legs: None Weakness of Arms/Hands: None       Abuse/Neglect Assessment (Assessment to  be complete while patient is alone) Physical Abuse:  (UTA) Verbal Abuse:  (UTA) Sexual Abuse:  (UTA) Exploitation of patient/patient's resources:  (UTA) Self-Neglect:  (UTA)     Advance Directives (For Healthcare) Does Patient Have a Medical Advance Directive?: No Does patient want to make changes to medical advance directive?: No - Patient declined Type of Advance Directive: [place] in Chart?: No - copy requested    Additional Information 1:1 In Past 12 Months?: No CIRT Risk: No Elopement Risk: No Does patient have medical clearance?: No     Disposition: Patriciaann Clan, PA recommends AM Psychiatric Evaluation. Disposition discussed with Claiborne Billings, [place] and Margaretha Sheffield, RN.  Disposition Initial Assessment Completed for this Encounter: Yes Disposition of Patient: Other dispositions (AM Psychiatric Evaluation) Other disposition(s): Other (Comment) (AM Psychiatric Evaluation)  Edd Fabian 07/02/2016 3:42 AM   Edd Fabian, MS, [facility], [facility] Triage Specialist [phone]

## 2016-07-02 NOTE — ED Notes (Signed)
Patient reported wound on right heel to nurse.  Patient reports that area is throbbing.  MD notified and a telfa and gauze dressing applied until wound nurse could see patient.  Yellow exudate present on wound surface.

## 2016-07-02 NOTE — ED Provider Notes (Signed)
I assumed care of this patient at sign out this morning. On my assessment, patient is pleasant and cooperative but he has been combative with staff. I was notified by RN of mildly erythematous ulcer to right achilles area. I suspect this is chronic wound 2/2 local pressure from shoes, but will place on doxy to prevent superinfection. He has no leukocytosis, fever, or evidence of sepsis and I do not suspect behavioral changes are due to this localized, superficial skin irritation. Will check plain films for eval of underlying bony abnormality/trauma and re-assess.   Duffy Bruce, MD 07/02/16 1524

## 2016-07-02 NOTE — ED Notes (Signed)
Patient resting in bed now.  Took po medication without difficulty.

## 2016-07-02 NOTE — ED Notes (Signed)
Pt is A&O x 4.  Pt speaks loudly.  Pt stated "my batteries in my hearing aids, I think, are dead."

## 2016-07-02 NOTE — ED Notes (Signed)
ED Provider at bedside. 

## 2016-07-02 NOTE — ED Notes (Signed)
Patient transported to X-ray 

## 2016-07-02 NOTE — ED Notes (Signed)
Patient became suddenly agitated.  Came out of room saying he wanted to leave and being very loud.  Patient redirected back to room by security and NP notified.

## 2016-07-02 NOTE — Consult Note (Signed)
[place] Psychiatry Consult   Reason for Consult: psychiatric evaluation Referring Physician:  EDP Patient Identification: Johnny May MRN:  [id number] Principal Diagnosis: Moderate dementia with behavioral disturbance Diagnosis:   Patient Active Problem List   Diagnosis Date Noted  . Moderate dementia with behavioral disturbance [F03.91] 06/18/2016    Priority: High    Total Time spent with patient: 45 minutes  Subjective:   Johnny May is a 83 y.o. male patient admitted with aggressive behavior.  HPI:  Patient with history of Dementia and multiple medical problem. He was brought to [facility] for evaluation due to increased aggression and agitation. Patient currently lives alone but he is in process of transitioning into  [facility] assited living facility. History was obtained from patient and his son who is his power of attorney. Son states the patient  has become belligerent and resisting moving into [facility]. Today, patient is calm, cooperative and pleasant. He denies suicidal/homicidal ideation, delusion, psychosis and says he is now agreeable to be transferred to [facility] assisted living facility.   Past Psychiatric History: as above  Risk to Self: Suicidal Ideation: No (Pt denies. ) Suicidal Intent: No Is patient at risk for suicide?: No Suicidal Plan?: No Access to Means: No What has been your use of drugs/alcohol within the last 12 months?: Pending How many times?: 0 Other Self Harm Risks: Pt denies. Triggers for Past Attempts: None known Intentional Self Injurious Behavior: None (Pt denies. ) Risk to Others: Homicidal Ideation: No (Pt denies. ) Thoughts of Harm to Others: No Current Homicidal Intent: No Current Homicidal Plan: No Access to Homicidal Means: No Identified Victim: NA History of harm to others?: No Assessment of Violence: On admission Violent Behavior Description: Per chart, pt has became beligerent and threatening sister.  Does patient have access to  weapons?: No (shot gun for hunting) Criminal Charges Pending?: No Does patient have a court date: No Prior Inpatient Therapy: Prior Inpatient Therapy:  (UTA) Prior Therapy Dates: UTA Prior Therapy Facilty/Provider(s): UTA Reason for Treatment: UTA Prior Outpatient Therapy: Prior Outpatient Therapy: No Prior Therapy Dates: NA Prior Therapy Facilty/Provider(s): NA Reason for Treatment: NA Does patient have an ACCT team?: No Does patient have Intensive In-House Services?  : No Does patient have [facility] services? : No Does patient have P4CC services?: No  Past Medical History:  Past Medical History:  Diagnosis Date  . Anxiety   . BPH (benign prostatic hyperplasia)   . Hyperlipidemia   . Hypertension   . Memory loss   . Osteoarthritis   . RLS (restless legs syndrome)   . Senile purpura ([place])   . Vitamin B12 deficiency     Past Surgical History:  Procedure Laterality Date  . PROSTATE BIOPSY     Family History:  Family History  Problem Relation Age of Onset  . Heart disease Mother   . Heart disease Father    Family Psychiatric  History:  Social History:  History  Alcohol Use No     History  Drug Use No    Social History   Social History  . Marital status: Married    Spouse name: N/A  . Number of children: N/A  . Years of education: N/A   Social History Main Topics  . Smoking status: Former Smoker    Packs/day: 1.00    Quit date: 03/09/1966  . Smokeless tobacco: Never Used     Comment: 50 years ago quit  . Alcohol use No  . Drug use: No  . Sexual  activity: Not Asked   Other Topics Concern  . None   Social History Narrative   Pt lives at home alone. Widow as of 2018.  One son. Retired.     Caffeine 1 drink daily.  Is driving.  Education HS.   2 cups coffee daily.   Additional Social History:    Allergies:  No Known Allergies  Labs:  Results for orders placed or performed during the hospital encounter of 07/01/16 (from the past 48 hour(s))   Comprehensive metabolic panel     Status: Abnormal   Collection Time: 07/01/16 11:42 PM  Result Value Ref Range   Sodium 140 135 - 145 mmol/L   Potassium 3.3 (L) 3.5 - 5.1 mmol/L   Chloride 105 101 - 111 mmol/L   CO2 28 22 - 32 mmol/L   Glucose, Bld 96 65 - 99 mg/dL   BUN 16 6 - 20 mg/dL   Creatinine, Ser 1.05 0.61 - 1.24 mg/dL   Calcium 8.7 (L) 8.9 - 10.3 mg/dL   Total Protein 6.8 6.5 - 8.1 g/dL   Albumin 3.7 3.5 - 5.0 g/dL   AST 20 15 - 41 U/L   ALT 13 (L) 17 - 63 U/L   Alkaline Phosphatase 54 38 - 126 U/L   Total Bilirubin 0.4 0.3 - 1.2 mg/dL   GFR calc non Af Amer >60 >60 mL/min   GFR calc Af Amer >60 >60 mL/min    Comment: (NOTE) The eGFR has been calculated using the CKD EPI equation. This calculation has not been validated in all clinical situations. eGFR's persistently <60 mL/min signify possible Chronic Kidney Disease.    Anion gap 7 5 - 15  Ethanol     Status: None   Collection Time: 07/01/16 11:42 PM  Result Value Ref Range   Alcohol, Ethyl (B) <5 <5 mg/dL    Comment:        LOWEST DETECTABLE LIMIT FOR SERUM ALCOHOL IS 5 mg/dL FOR MEDICAL PURPOSES ONLY   Salicylate level     Status: None   Collection Time: 07/01/16 11:42 PM  Result Value Ref Range   Salicylate Lvl <8.6 2.8 - 30.0 mg/dL  Acetaminophen level     Status: None   Collection Time: 07/01/16 11:42 PM  Result Value Ref Range   Acetaminophen (Tylenol), Serum 12 10 - 30 ug/mL    Comment:        THERAPEUTIC CONCENTRATIONS VARY SIGNIFICANTLY. A RANGE OF 10-30 ug/mL MAY BE AN EFFECTIVE CONCENTRATION FOR MANY PATIENTS. HOWEVER, SOME ARE BEST TREATED AT CONCENTRATIONS OUTSIDE THIS RANGE. ACETAMINOPHEN CONCENTRATIONS >150 ug/mL AT 4 HOURS AFTER INGESTION AND >50 ug/mL AT 12 HOURS AFTER INGESTION ARE OFTEN ASSOCIATED WITH TOXIC REACTIONS.   cbc     Status: Abnormal   Collection Time: 07/01/16 11:42 PM  Result Value Ref Range   WBC 9.0 4.0 - 10.5 K/uL   RBC 3.71 (L) 4.22 - 5.81 MIL/uL    Hemoglobin 11.4 (L) 13.0 - 17.0 g/dL   HCT 34.6 (L) 39.0 - 52.0 %   MCV 93.3 78.0 - 100.0 fL   MCH 30.7 26.0 - 34.0 pg   MCHC 32.9 30.0 - 36.0 g/dL   RDW 14.1 11.5 - 15.5 %   Platelets 239 150 - 400 K/uL  Urinalysis, Routine w reflex microscopic     Status: Abnormal   Collection Time: 07/02/16  9:00 AM  Result Value Ref Range   Color, Urine STRAW (A) YELLOW   APPearance CLEAR CLEAR  Specific Gravity, Urine 1.009 1.005 - 1.030   pH 6.0 5.0 - 8.0   Glucose, UA NEGATIVE NEGATIVE mg/dL   Hgb urine dipstick SMALL (A) NEGATIVE   Bilirubin Urine NEGATIVE NEGATIVE   Ketones, ur NEGATIVE NEGATIVE mg/dL   Protein, ur NEGATIVE NEGATIVE mg/dL   Nitrite NEGATIVE NEGATIVE   Leukocytes, UA NEGATIVE NEGATIVE   RBC / HPF 0-5 0 - 5 RBC/hpf   WBC, UA NONE SEEN 0 - 5 WBC/hpf   Bacteria, UA NONE SEEN NONE SEEN   Squamous Epithelial / LPF NONE SEEN NONE SEEN    Current Facility-Administered Medications  Medication Dose Route Frequency Provider Last Rate Last Dose  . aspirin EC tablet 162 mg  162 mg Oral Ernie Hew, PA-C   162 mg at 07/02/16 9767  . citalopram (CELEXA) tablet 10 mg  10 mg Oral Daily Remas Sobel, MD      . donepezil (ARICEPT) tablet 5 mg  5 mg Oral QHS Kelly Humes, PA-C      . QUEtiapine (SEROQUEL) tablet 50 mg  50 mg Oral QHS Corena Pilgrim, MD       Current Outpatient Prescriptions  Medication Sig Dispense Refill  . aspirin EC 81 MG tablet Take 162 mg by mouth every other day.    . donepezil (ARICEPT) 5 MG tablet Take 5 mg by mouth at bedtime.      Musculoskeletal: Strength & Muscle Tone: within normal limits Gait & Station: normal Patient leans: N/A  Psychiatric Specialty Exam: Physical Exam  ROS  Blood pressure (!) 153/103, pulse 79, temperature 97.5 F (36.4 C), temperature source Oral, resp. rate 18, SpO2 100 %.There is no height or weight on file to calculate BMI.  General Appearance: Casual  Eye Contact:  Good  Speech:  Clear and Coherent  Volume:   Normal  Mood:  Anxious  Affect:  Appropriate  Thought Process:  Coherent and Descriptions of Associations: Intact  Orientation:  Other:  only to person and place  Thought Content:  Logical  Suicidal Thoughts:  No  Homicidal Thoughts:  No  Memory:  Immediate;   Fair Recent;   Poor Remote;   Poor  Judgement:  Other:  marginal  Insight:  marginal  Psychomotor Activity:  Normal  Concentration:  Concentration: Fair and Attention Span: Fair  Recall:  [employer] of Knowledge:  Fair  Language:  Good  Akathisia:  No  Handed:  Right  AIMS (if indicated):     Assets:  Armed forces logistics/support/administrative officer Social Support  ADL's:  Impaired  Cognition:  Impaired,  Mild  Sleep:   fair     Treatment Plan Summary: Plan Patient is cleared by psychiatrist for discharge to Assited living facility  Continue Seroquel 50 mg qhs for sleep and Celexa 10 mg daily for aggressive behavior.  Disposition: No evidence of imminent risk to self or others at present.   Patient does not meet criteria for psychiatric inpatient admission. Patient can be discharged to his son for transfer to [facility] assisted living facility.  Corena Pilgrim, MD 07/02/2016 10:54 AM

## 2016-07-02 NOTE — ED Notes (Signed)
Patient visiting with son who brought in paper work for [employer].

## 2016-07-03 MED ORDER — CITALOPRAM HYDROBROMIDE 10 MG PO TABS: 10.0000 mg | ORAL_TABLET | Freq: Every day | ORAL | 0 refills | Status: DC

## 2016-07-03 MED ORDER — MUPIROCIN CALCIUM 2 % EX CREA: TOPICAL_CREAM | Freq: Every day | CUTANEOUS | 0 refills | Status: DC

## 2016-07-03 MED ORDER — DOXYCYCLINE HYCLATE 100 MG PO TABS: 100.0000 mg | ORAL_TABLET | Freq: Two times a day (BID) | ORAL | 0 refills | Status: DC

## 2016-07-03 MED ORDER — QUETIAPINE FUMARATE 50 MG PO TABS: 50.0000 mg | ORAL_TABLET | Freq: Every day | ORAL | 0 refills | Status: DC

## 2016-07-03 MED ORDER — MUPIROCIN CALCIUM 2 % EX CREA
TOPICAL_CREAM | Freq: Every day | CUTANEOUS | Status: DC
  Administered 2016-07-03: 11:00:00 via TOPICAL
  Filled 2016-07-03: qty 15

## 2016-07-03 NOTE — Consult Note (Addendum)
[place] Nurse wound consult note Reason for Consult: Consult requested for right achilles wound.  X-ray results state: "No radiographic evidence of osteomyelitis, Some soft tissue swelling is noted over the degenerative spur emanating from the insertion of the Achilles tendon."  WOC is currently at another campus and unable to assess the wound in person.  Pt is planning to leave this am and topical treatment orders are requested to facilitate discharge at this time.  Wound type: Full thickness According to the ER progress notes; "the physician was notified by RN of mildly erythematous ulcer to right achilles area. I suspect this is chronic wound 2/2 local pressure from shoes, but will place on doxy to prevent superinfection." Dressing procedure/placement/frequency: Bactroban to promote moist healing, gauze dressing to protect from further injury. Since wound is related to a bone spur, this will probably be a chronic condition which will be difficult to promote healing where shoes rub over the affected area. Discussed plan of care with bedside nurse in the ER over the phone. Please re-consult if further assistance is needed.  Thank-you,  Julien Girt MSN, [place], [place], [place], [place]

## 2016-07-03 NOTE — ED Notes (Signed)
Pen pad not working at this time. Pt son verbalized understanding of discharge medications and stated he had no further questions regarding discharge.

## 2016-07-03 NOTE — Progress Notes (Signed)
CSW spoke with [facility] and provided them with needed information for patients acceptance.   Kingsley Spittle, LCSWA Clinical Social Worker [phone]

## 2016-07-03 NOTE — BHH Suicide Risk Assessment (Signed)
Suicide Risk Assessment  Discharge Assessment   [facility] Discharge Suicide Risk Assessment   Principal Problem: Moderate dementia with behavioral disturbance Discharge Diagnoses:  Patient Active Problem List   Diagnosis Date Noted  . Moderate dementia with behavioral disturbance [F03.91] 06/18/2016    Total Time spent with patient: 45 minutes  Musculoskeletal: Strength & Muscle Tone: within normal limits Gait & Station: normal Patient leans: N/A  Psychiatric Specialty Exam: Physical Exam  ROS  Blood pressure (!) 153/103, pulse 79, temperature 97.5 F (36.4 C), temperature source Oral, resp. rate 18, SpO2 100 %.There is no height or weight on file to calculate BMI.  General Appearance: Casual  Eye Contact:  Good  Speech:  Clear and Coherent  Volume:  Normal  Mood:  Anxious  Affect:  Appropriate  Thought Process:  Coherent and Descriptions of Associations: Intact  Orientation:  Other:  only to person and place  Thought Content:  Logical  Suicidal Thoughts:  No  Homicidal Thoughts:  No  Memory:  Immediate;   Fair Recent;   Poor Remote;   Poor  Judgement:  Other:  marginal  Insight:  marginal  Psychomotor Activity:  Normal  Concentration:  Concentration: Fair and Attention Span: Fair  Recall:  [employer] of Knowledge:  Fair  Language:  Good  Akathisia:  No  Handed:  Right  AIMS (if indicated):     Assets:  Armed forces logistics/support/administrative officer Social Support  ADL's:  Impaired  Cognition:  Impaired,  Mild  Sleep:   fair    Mental Status Per Nursing Assessment::   On Admission:   aggression  Demographic Factors:  Male, Age 27 or older and Caucasian  Loss Factors: NA  Historical Factors: NA  Risk Reduction Factors:   Sense of responsibility to family and Positive social support  Continued Clinical Symptoms:  None  Cognitive Features That Contribute To Risk:  None    Suicide Risk:  Minimal: No identifiable suicidal ideation.  Patients presenting with no risk factors  but with morbid ruminations; may be classified as minimal risk based on the severity of the depressive symptoms    Plan Of Care/Follow-up recommendations:  Activity:  as tolerated Diet:  heart healthy diet  Johnny Roes, NP 07/03/2016, 10:30 AM

## 2016-07-03 NOTE — ED Notes (Signed)
Wound care orders were completed. Ointment and dressing in place.

## 2016-07-03 NOTE — NC FL2 (Signed)
  [place] LEVEL OF CARE SCREENING TOOL     IDENTIFICATION  Patient Name: Johnny May Birthdate: 1923-07-11 Sex: male Admission Date (Current Location): 07/01/2016  [facility] and [place] Number:  Herbalist and Address:  [facility],  [place] [address] [place]      Provider Number: [id number]  Attending Physician Name and Address:  Provider Default, MD  Relative Name and Phone Number:       Current Level of Care: Hospital Recommended Level of Care: [place] Prior Approval Number:    Date Approved/Denied:   PASRR Number:    Discharge Plan: Other (Comment) (ALF)    Current Diagnoses: Patient Active Problem List   Diagnosis Date Noted  . Moderate dementia with behavioral disturbance 06/18/2016    Orientation RESPIRATION BLADDER Height & Weight     Self, Situation  Normal Continent Weight: 147 lb (66.7 kg) Height:  5\' 9"  (175.3 cm)  BEHAVIORAL SYMPTOMS/MOOD NEUROLOGICAL BOWEL NUTRITION STATUS      Continent Diet (Regular)  AMBULATORY STATUS COMMUNICATION OF NEEDS Skin   Limited Assist Verbally Normal                       Personal Care Assistance Level of Assistance  Bathing, Feeding, Dressing Bathing Assistance: Limited assistance Feeding assistance: Independent Dressing Assistance: Limited assistance     Functional Limitations Info             SPECIAL CARE FACTORS FREQUENCY                       Contractures      Additional Factors Info  Code Status, Allergies Code Status Info: Full Code  Allergies Info: NKA            Current Medications (07/03/2016):  This is the current hospital active medication list Current Facility-Administered Medications  Medication Dose Route Frequency Provider Last Rate Last Dose  . aspirin EC tablet 162 mg  162 mg Oral Ernie Hew, PA-C   162 mg at 07/02/16 5009  . citalopram (CELEXA) tablet 10 mg  10 mg Oral Daily Corena Pilgrim, MD    10 mg at 07/03/16 0921  . donepezil (ARICEPT) tablet 5 mg  5 mg Oral QHS Antonietta Breach, PA-C   5 mg at 07/02/16 2111  . doxycycline (VIBRA-TABS) tablet 100 mg  100 mg Oral Q12H Duffy Bruce, MD   100 mg at 07/03/16 0921  . QUEtiapine (SEROQUEL) tablet 50 mg  50 mg Oral QHS Corena Pilgrim, MD   50 mg at 07/02/16 2112   Current Outpatient Prescriptions  Medication Sig Dispense Refill  . aspirin EC 81 MG tablet Take 162 mg by mouth every other day.    . donepezil (ARICEPT) 5 MG tablet Take 5 mg by mouth at bedtime.       Discharge Medications: Please see discharge summary for a list of discharge medications.  Relevant Imaging Results:  Relevant Lab Results:   Additional [place] with give B-12 shot   Weston Anna, [place]

## 2017-03-15 DIAGNOSIS — F419 Anxiety disorder, unspecified: Secondary | ICD-10-CM | POA: Diagnosis not present

## 2017-03-15 DIAGNOSIS — F0391 Unspecified dementia with behavioral disturbance: Secondary | ICD-10-CM | POA: Diagnosis not present

## 2017-03-15 DIAGNOSIS — F5102 Adjustment insomnia: Secondary | ICD-10-CM | POA: Diagnosis not present

## 2017-04-12 DIAGNOSIS — F39 Unspecified mood [affective] disorder: Secondary | ICD-10-CM | POA: Diagnosis not present

## 2017-04-12 DIAGNOSIS — F0391 Unspecified dementia with behavioral disturbance: Secondary | ICD-10-CM | POA: Diagnosis not present

## 2017-04-12 DIAGNOSIS — F419 Anxiety disorder, unspecified: Secondary | ICD-10-CM | POA: Diagnosis not present

## 2017-04-12 DIAGNOSIS — F5102 Adjustment insomnia: Secondary | ICD-10-CM | POA: Diagnosis not present

## 2017-05-10 DIAGNOSIS — F5102 Adjustment insomnia: Secondary | ICD-10-CM | POA: Diagnosis not present

## 2017-05-10 DIAGNOSIS — F39 Unspecified mood [affective] disorder: Secondary | ICD-10-CM | POA: Diagnosis not present

## 2017-05-10 DIAGNOSIS — F419 Anxiety disorder, unspecified: Secondary | ICD-10-CM | POA: Diagnosis not present

## 2017-05-10 DIAGNOSIS — F0391 Unspecified dementia with behavioral disturbance: Secondary | ICD-10-CM | POA: Diagnosis not present

## 2017-06-07 DIAGNOSIS — F39 Unspecified mood [affective] disorder: Secondary | ICD-10-CM | POA: Diagnosis not present

## 2017-06-07 DIAGNOSIS — F0391 Unspecified dementia with behavioral disturbance: Secondary | ICD-10-CM | POA: Diagnosis not present

## 2017-06-07 DIAGNOSIS — F5102 Adjustment insomnia: Secondary | ICD-10-CM | POA: Diagnosis not present

## 2017-06-07 DIAGNOSIS — F419 Anxiety disorder, unspecified: Secondary | ICD-10-CM | POA: Diagnosis not present

## 2017-07-07 DIAGNOSIS — F419 Anxiety disorder, unspecified: Secondary | ICD-10-CM | POA: Diagnosis not present

## 2017-07-07 DIAGNOSIS — F0391 Unspecified dementia with behavioral disturbance: Secondary | ICD-10-CM | POA: Diagnosis not present

## 2017-07-07 DIAGNOSIS — F5102 Adjustment insomnia: Secondary | ICD-10-CM | POA: Diagnosis not present

## 2017-07-07 DIAGNOSIS — F39 Unspecified mood [affective] disorder: Secondary | ICD-10-CM | POA: Diagnosis not present

## 2017-08-03 DIAGNOSIS — F0391 Unspecified dementia with behavioral disturbance: Secondary | ICD-10-CM | POA: Diagnosis not present

## 2017-08-03 DIAGNOSIS — I10 Essential (primary) hypertension: Secondary | ICD-10-CM | POA: Diagnosis not present

## 2017-08-03 DIAGNOSIS — F39 Unspecified mood [affective] disorder: Secondary | ICD-10-CM | POA: Diagnosis not present

## 2017-08-03 DIAGNOSIS — N400 Enlarged prostate without lower urinary tract symptoms: Secondary | ICD-10-CM | POA: Diagnosis not present

## 2017-08-03 DIAGNOSIS — H9193 Unspecified hearing loss, bilateral: Secondary | ICD-10-CM | POA: Diagnosis not present

## 2017-08-03 DIAGNOSIS — G4700 Insomnia, unspecified: Secondary | ICD-10-CM | POA: Diagnosis not present

## 2017-08-03 DIAGNOSIS — Z79899 Other long term (current) drug therapy: Secondary | ICD-10-CM | POA: Diagnosis not present

## 2017-09-06 DIAGNOSIS — F0391 Unspecified dementia with behavioral disturbance: Secondary | ICD-10-CM | POA: Diagnosis not present

## 2017-09-06 DIAGNOSIS — F5102 Adjustment insomnia: Secondary | ICD-10-CM | POA: Diagnosis not present

## 2017-09-06 DIAGNOSIS — F39 Unspecified mood [affective] disorder: Secondary | ICD-10-CM | POA: Diagnosis not present

## 2017-09-06 DIAGNOSIS — F419 Anxiety disorder, unspecified: Secondary | ICD-10-CM | POA: Diagnosis not present

## 2017-09-28 DIAGNOSIS — D519 Vitamin B12 deficiency anemia, unspecified: Secondary | ICD-10-CM | POA: Diagnosis not present

## 2017-10-07 DIAGNOSIS — F0391 Unspecified dementia with behavioral disturbance: Secondary | ICD-10-CM | POA: Diagnosis not present

## 2017-10-07 DIAGNOSIS — F419 Anxiety disorder, unspecified: Secondary | ICD-10-CM | POA: Diagnosis not present

## 2017-10-07 DIAGNOSIS — F39 Unspecified mood [affective] disorder: Secondary | ICD-10-CM | POA: Diagnosis not present

## 2017-10-07 DIAGNOSIS — F5102 Adjustment insomnia: Secondary | ICD-10-CM | POA: Diagnosis not present

## 2017-10-14 DIAGNOSIS — F0391 Unspecified dementia with behavioral disturbance: Secondary | ICD-10-CM | POA: Diagnosis not present

## 2017-10-14 DIAGNOSIS — F39 Unspecified mood [affective] disorder: Secondary | ICD-10-CM | POA: Diagnosis not present

## 2017-10-14 DIAGNOSIS — F5102 Adjustment insomnia: Secondary | ICD-10-CM | POA: Diagnosis not present

## 2017-10-14 DIAGNOSIS — F419 Anxiety disorder, unspecified: Secondary | ICD-10-CM | POA: Diagnosis not present

## 2017-10-22 DIAGNOSIS — R488 Other symbolic dysfunctions: Secondary | ICD-10-CM | POA: Diagnosis not present

## 2017-10-22 DIAGNOSIS — R41841 Cognitive communication deficit: Secondary | ICD-10-CM | POA: Diagnosis not present

## 2017-10-28 DIAGNOSIS — R41841 Cognitive communication deficit: Secondary | ICD-10-CM | POA: Diagnosis not present

## 2017-10-28 DIAGNOSIS — R488 Other symbolic dysfunctions: Secondary | ICD-10-CM | POA: Diagnosis not present

## 2017-10-29 DIAGNOSIS — R488 Other symbolic dysfunctions: Secondary | ICD-10-CM | POA: Diagnosis not present

## 2017-10-29 DIAGNOSIS — R41841 Cognitive communication deficit: Secondary | ICD-10-CM | POA: Diagnosis not present

## 2017-11-01 DIAGNOSIS — R41841 Cognitive communication deficit: Secondary | ICD-10-CM | POA: Diagnosis not present

## 2017-11-01 DIAGNOSIS — R488 Other symbolic dysfunctions: Secondary | ICD-10-CM | POA: Diagnosis not present

## 2017-11-03 DIAGNOSIS — R488 Other symbolic dysfunctions: Secondary | ICD-10-CM | POA: Diagnosis not present

## 2017-11-03 DIAGNOSIS — R41841 Cognitive communication deficit: Secondary | ICD-10-CM | POA: Diagnosis not present

## 2017-11-04 DIAGNOSIS — R488 Other symbolic dysfunctions: Secondary | ICD-10-CM | POA: Diagnosis not present

## 2017-11-04 DIAGNOSIS — R41841 Cognitive communication deficit: Secondary | ICD-10-CM | POA: Diagnosis not present

## 2017-11-08 DIAGNOSIS — F5102 Adjustment insomnia: Secondary | ICD-10-CM | POA: Diagnosis not present

## 2017-11-08 DIAGNOSIS — F0391 Unspecified dementia with behavioral disturbance: Secondary | ICD-10-CM | POA: Diagnosis not present

## 2017-11-08 DIAGNOSIS — F39 Unspecified mood [affective] disorder: Secondary | ICD-10-CM | POA: Diagnosis not present

## 2017-11-08 DIAGNOSIS — F419 Anxiety disorder, unspecified: Secondary | ICD-10-CM | POA: Diagnosis not present

## 2017-11-10 DIAGNOSIS — R488 Other symbolic dysfunctions: Secondary | ICD-10-CM | POA: Diagnosis not present

## 2017-11-10 DIAGNOSIS — R41841 Cognitive communication deficit: Secondary | ICD-10-CM | POA: Diagnosis not present

## 2017-11-11 DIAGNOSIS — R41841 Cognitive communication deficit: Secondary | ICD-10-CM | POA: Diagnosis not present

## 2017-11-11 DIAGNOSIS — R488 Other symbolic dysfunctions: Secondary | ICD-10-CM | POA: Diagnosis not present

## 2017-11-12 DIAGNOSIS — R488 Other symbolic dysfunctions: Secondary | ICD-10-CM | POA: Diagnosis not present

## 2017-11-12 DIAGNOSIS — R41841 Cognitive communication deficit: Secondary | ICD-10-CM | POA: Diagnosis not present

## 2017-11-15 DIAGNOSIS — R488 Other symbolic dysfunctions: Secondary | ICD-10-CM | POA: Diagnosis not present

## 2017-11-15 DIAGNOSIS — R41841 Cognitive communication deficit: Secondary | ICD-10-CM | POA: Diagnosis not present

## 2017-11-18 DIAGNOSIS — R488 Other symbolic dysfunctions: Secondary | ICD-10-CM | POA: Diagnosis not present

## 2017-11-18 DIAGNOSIS — R41841 Cognitive communication deficit: Secondary | ICD-10-CM | POA: Diagnosis not present

## 2017-11-19 DIAGNOSIS — R488 Other symbolic dysfunctions: Secondary | ICD-10-CM | POA: Diagnosis not present

## 2017-11-19 DIAGNOSIS — R41841 Cognitive communication deficit: Secondary | ICD-10-CM | POA: Diagnosis not present

## 2017-11-22 DIAGNOSIS — R41841 Cognitive communication deficit: Secondary | ICD-10-CM | POA: Diagnosis not present

## 2017-11-22 DIAGNOSIS — R488 Other symbolic dysfunctions: Secondary | ICD-10-CM | POA: Diagnosis not present

## 2017-11-23 DIAGNOSIS — R41841 Cognitive communication deficit: Secondary | ICD-10-CM | POA: Diagnosis not present

## 2017-11-23 DIAGNOSIS — R488 Other symbolic dysfunctions: Secondary | ICD-10-CM | POA: Diagnosis not present

## 2017-11-26 DIAGNOSIS — R41841 Cognitive communication deficit: Secondary | ICD-10-CM | POA: Diagnosis not present

## 2017-11-26 DIAGNOSIS — R488 Other symbolic dysfunctions: Secondary | ICD-10-CM | POA: Diagnosis not present

## 2017-11-29 DIAGNOSIS — R41841 Cognitive communication deficit: Secondary | ICD-10-CM | POA: Diagnosis not present

## 2017-11-29 DIAGNOSIS — R488 Other symbolic dysfunctions: Secondary | ICD-10-CM | POA: Diagnosis not present

## 2017-11-30 DIAGNOSIS — R488 Other symbolic dysfunctions: Secondary | ICD-10-CM | POA: Diagnosis not present

## 2017-11-30 DIAGNOSIS — F0391 Unspecified dementia with behavioral disturbance: Secondary | ICD-10-CM | POA: Diagnosis not present

## 2017-11-30 DIAGNOSIS — I10 Essential (primary) hypertension: Secondary | ICD-10-CM | POA: Diagnosis not present

## 2017-11-30 DIAGNOSIS — R41841 Cognitive communication deficit: Secondary | ICD-10-CM | POA: Diagnosis not present

## 2017-11-30 DIAGNOSIS — N400 Enlarged prostate without lower urinary tract symptoms: Secondary | ICD-10-CM | POA: Diagnosis not present

## 2017-11-30 DIAGNOSIS — G2581 Restless legs syndrome: Secondary | ICD-10-CM | POA: Diagnosis not present

## 2017-12-03 DIAGNOSIS — R41841 Cognitive communication deficit: Secondary | ICD-10-CM | POA: Diagnosis not present

## 2017-12-03 DIAGNOSIS — R488 Other symbolic dysfunctions: Secondary | ICD-10-CM | POA: Diagnosis not present

## 2017-12-06 DIAGNOSIS — R41841 Cognitive communication deficit: Secondary | ICD-10-CM | POA: Diagnosis not present

## 2017-12-06 DIAGNOSIS — R488 Other symbolic dysfunctions: Secondary | ICD-10-CM | POA: Diagnosis not present

## 2017-12-07 DIAGNOSIS — F5102 Adjustment insomnia: Secondary | ICD-10-CM | POA: Diagnosis not present

## 2017-12-07 DIAGNOSIS — F39 Unspecified mood [affective] disorder: Secondary | ICD-10-CM | POA: Diagnosis not present

## 2017-12-07 DIAGNOSIS — F419 Anxiety disorder, unspecified: Secondary | ICD-10-CM | POA: Diagnosis not present

## 2017-12-07 DIAGNOSIS — F0391 Unspecified dementia with behavioral disturbance: Secondary | ICD-10-CM | POA: Diagnosis not present

## 2017-12-08 DIAGNOSIS — R488 Other symbolic dysfunctions: Secondary | ICD-10-CM | POA: Diagnosis not present

## 2017-12-08 DIAGNOSIS — R41841 Cognitive communication deficit: Secondary | ICD-10-CM | POA: Diagnosis not present

## 2017-12-10 DIAGNOSIS — R41841 Cognitive communication deficit: Secondary | ICD-10-CM | POA: Diagnosis not present

## 2017-12-10 DIAGNOSIS — R488 Other symbolic dysfunctions: Secondary | ICD-10-CM | POA: Diagnosis not present

## 2017-12-14 DIAGNOSIS — Z79899 Other long term (current) drug therapy: Secondary | ICD-10-CM | POA: Diagnosis not present

## 2017-12-14 DIAGNOSIS — M79671 Pain in right foot: Secondary | ICD-10-CM | POA: Diagnosis not present

## 2017-12-21 DIAGNOSIS — I10 Essential (primary) hypertension: Secondary | ICD-10-CM | POA: Diagnosis not present

## 2017-12-21 DIAGNOSIS — J069 Acute upper respiratory infection, unspecified: Secondary | ICD-10-CM | POA: Diagnosis not present

## 2017-12-21 DIAGNOSIS — Z79899 Other long term (current) drug therapy: Secondary | ICD-10-CM | POA: Diagnosis not present

## 2017-12-21 DIAGNOSIS — N400 Enlarged prostate without lower urinary tract symptoms: Secondary | ICD-10-CM | POA: Diagnosis not present

## 2017-12-21 DIAGNOSIS — E785 Hyperlipidemia, unspecified: Secondary | ICD-10-CM | POA: Diagnosis not present

## 2018-01-06 DIAGNOSIS — F0391 Unspecified dementia with behavioral disturbance: Secondary | ICD-10-CM | POA: Diagnosis not present

## 2018-01-06 DIAGNOSIS — F419 Anxiety disorder, unspecified: Secondary | ICD-10-CM | POA: Diagnosis not present

## 2018-01-06 DIAGNOSIS — F39 Unspecified mood [affective] disorder: Secondary | ICD-10-CM | POA: Diagnosis not present

## 2018-01-06 DIAGNOSIS — F5102 Adjustment insomnia: Secondary | ICD-10-CM | POA: Diagnosis not present

## 2018-02-07 DIAGNOSIS — F0391 Unspecified dementia with behavioral disturbance: Secondary | ICD-10-CM | POA: Diagnosis not present

## 2018-02-07 DIAGNOSIS — F419 Anxiety disorder, unspecified: Secondary | ICD-10-CM | POA: Diagnosis not present

## 2018-02-07 DIAGNOSIS — F5102 Adjustment insomnia: Secondary | ICD-10-CM | POA: Diagnosis not present

## 2018-02-07 DIAGNOSIS — F39 Unspecified mood [affective] disorder: Secondary | ICD-10-CM | POA: Diagnosis not present

## 2018-03-10 DIAGNOSIS — F39 Unspecified mood [affective] disorder: Secondary | ICD-10-CM | POA: Diagnosis not present

## 2018-03-10 DIAGNOSIS — F419 Anxiety disorder, unspecified: Secondary | ICD-10-CM | POA: Diagnosis not present

## 2018-03-10 DIAGNOSIS — F0391 Unspecified dementia with behavioral disturbance: Secondary | ICD-10-CM | POA: Diagnosis not present

## 2018-03-10 DIAGNOSIS — F5102 Adjustment insomnia: Secondary | ICD-10-CM | POA: Diagnosis not present

## 2018-03-22 DIAGNOSIS — Z79899 Other long term (current) drug therapy: Secondary | ICD-10-CM | POA: Diagnosis not present

## 2018-03-22 DIAGNOSIS — R2242 Localized swelling, mass and lump, left lower limb: Secondary | ICD-10-CM | POA: Diagnosis not present

## 2018-03-22 DIAGNOSIS — F331 Major depressive disorder, recurrent, moderate: Secondary | ICD-10-CM | POA: Diagnosis not present

## 2018-03-22 DIAGNOSIS — N400 Enlarged prostate without lower urinary tract symptoms: Secondary | ICD-10-CM | POA: Diagnosis not present

## 2018-03-22 DIAGNOSIS — H918X3 Other specified hearing loss, bilateral: Secondary | ICD-10-CM | POA: Diagnosis not present

## 2018-04-11 ENCOUNTER — Other Ambulatory Visit: Payer: Self-pay | Admitting: Surgery

## 2018-04-11 ENCOUNTER — Ambulatory Visit: Payer: Self-pay | Admitting: Surgery

## 2018-04-11 DIAGNOSIS — R599 Enlarged lymph nodes, unspecified: Secondary | ICD-10-CM

## 2018-04-11 NOTE — H&P (Signed)
History of Present Illness Johnny May. Johnny Ferrara MD; 04/11/2018 9:49 AM) The patient is a 85 year old male who presents with a complaint of Mass. Referred by Dr. Reymundo May for "purple thigh mass"  This is a 85 year old male with dementia, but relatively good physiologic health who presents with a couple of months of firm subcutaneous masses in both thighs. He is a resident at [facility] but is fairly independent and active. He states that he has minimal discomfort in the thigh masses, but he has started to have some discomfort in his right groin. He brought this to the attention of Dr. Fredderick May, who referred him to [place] for biopsy and management. No imaging of these areas.  He is accompanied by his son, who is his POA.   Past Surgical History Johnny May, CMA; 04/11/2018 9:10 AM) No pertinent past surgical history  Diagnostic Studies History Johnny May, CMA; 04/11/2018 9:10 AM) Colonoscopy >10 years ago  Allergies Johnny May, CMA; 04/11/2018 9:10 AM) No Known Drug Allergies [04/11/2018]:  Medication History Johnny May, CMA; 04/11/2018 9:10 AM) Citalopram Hydrobromide (10MG  Tablet, Oral) Active. Donepezil HCl (5MG  Tablet, Oral) Active. traZODone HCl (50MG  Tablet, Oral) Active. Aspirin (81MG  Tablet, Oral) Active. Medications Reconciled  Social History Johnny May, CMA; 04/11/2018 9:10 AM) Alcohol use Remotely quit alcohol use. Caffeine use Coffee. No drug use Tobacco use Former smoker.  Family History Johnny May, [place]; 04/11/2018 9:10 AM) Heart Disease Father.  Other Problems Johnny May, CMA; 04/11/2018 9:10 AM) Anxiety Disorder Arthritis     Review of Systems (Johnny May CMA; 04/11/2018 9:10 AM) General Not Present- Appetite Loss, Chills, Fatigue, Fever, Night Sweats, Weight Gain and Weight Loss. Skin Present- New Lesions. Not Present- Change in Wart/Mole, Dryness, Hives, Jaundice, Non-Healing Wounds, Rash and Ulcer. HEENT Present- Hearing Loss.  Not Present- Earache, Hoarseness, Nose Bleed, Oral Ulcers, Ringing in the Ears, Seasonal Allergies, Sinus Pain, Sore Throat, Visual Disturbances, Wears glasses/contact lenses and Yellow Eyes. Respiratory Not Present- Bloody sputum, Chronic Cough, Difficulty Breathing, Snoring and Wheezing. Breast Not Present- Breast Mass, Breast Pain, Nipple Discharge and Skin Changes. Cardiovascular Not Present- Chest Pain, Difficulty Breathing Lying Down, Leg Cramps, Palpitations, Rapid Heart Rate, Shortness of Breath and Swelling of Extremities. Gastrointestinal Not Present- Abdominal Pain, Bloating, Bloody Stool, Change in Bowel Habits, Chronic diarrhea, Constipation, Difficulty Swallowing, Excessive gas, Gets full quickly at meals, Hemorrhoids, Indigestion, Nausea, Rectal Pain and Vomiting. Male Genitourinary Present- Frequency. Not Present- Blood in Urine, Change in Urinary Stream, Impotence, Nocturia, Painful Urination, Urgency and Urine Leakage. Musculoskeletal Present- Joint Stiffness. Not Present- Back Pain, Joint Pain, Muscle Pain, Muscle Weakness and Swelling of Extremities. Neurological Present- Decreased Memory. Not Present- Fainting, Headaches, Numbness, Seizures, Tingling, Tremor, Trouble walking and Weakness. Psychiatric Not Present- Anxiety, Bipolar, Change in Sleep Pattern, Depression, Fearful and Frequent crying. Endocrine Not Present- Cold Intolerance, Excessive Hunger, Hair Changes, Heat Intolerance, Hot flashes and New Diabetes. Hematology Not Present- Blood Thinners, Easy Bruising, Excessive bleeding, Gland problems, HIV and Persistent Infections.  Vitals (Johnny May CMA; 04/11/2018 9:10 AM) 04/11/2018 9:09 AM Weight: 169.8 lb Height: 70in Body Surface Area: 1.95 m Body Mass Index: 24.36 kg/m  Pulse: 72 (Regular)  BP: 120/70 (Sitting, Left Arm, Standard)      Physical Exam Johnny Key K. Carsyn Taubman MD; 04/11/2018 9:53 AM)  The physical exam findings are as follows: Note:Elderly WDWN  in NAD Eyes: Pupils equal, round; sclera anicteric HENT: Oral mucosa moist; good dentition Neck: No masses palpated, no thyromegaly Lungs: CTA bilaterally; normal respiratory  effort CV: Regular rate and rhythm; no murmurs; extremities well-perfused with no edema Abd: +bowel sounds, soft, non-tender, no palpable organomegaly; no palpable hernias Right groin - massively enlarged inguinal lymph nodes with some necrosis - open wound measuring about 3 x 2 cm with underlying necrotic tissue and odor. There are small distinct lymph nodes laterally No obvious left inguinal lymphadenopathy Right medial distal thigh - purplish clustered nodules in the subcutaneous tissues, firm, non-tender; small area beginning to turn black. no drainage Left lateral distal thigh - purplish clustered nodules in the subcutaneous tissues, firm, non-tender; no necrosis or drainage No obvious distal skin lesions Psychiatric - alert and oriented x 4; calm mood and affect    Assessment & Plan Johnny Key K. Kameka Whan MD; 04/11/2018 9:54 AM)  Johnny May, INGUINAL (R59.0) Impression: Massive right inguinal lymphadenopathy - right groin with necrosis Bilateral thigh subcutaneous masses   Daily wet to dry dressings to right groin - 2x2 gauze moistened with saline, covered with dry gauze and tape  Current Plans Schedule for Surgery - Right groin lymph node biopsy. The surgical procedure has been discussed with the patient. Potential risks, benefits, alternative treatments, and expected outcomes have been explained. All of the patient's questions at this time have been answered. The likelihood of reaching the patient's treatment goal is good. The patient understand the proposed surgical procedure and wishes to proceed. Note:These physical findings are extremely worrisome for metastatic cancer. At this time the primary is unknown. The differential diagnosis includes lymphoma or metastatic melanoma. His management will be  complicated by his age and his dementia. I had a discussion privately with his son who is his healthcare power of attorney. We will first try to obtain some further information by getting a CT scan of the chest, abdomen, pelvis, and thighs. We will also perform an excisional lymph node biopsy of the small mobile subcutaneous node in the lateral right groin. This should provide tissue diagnosis for further discussion.  In the meantime, we will ask the nursing staff at [facility] to begin daily wet-to-dry dressings to the necrotic lymph node in the right groin.   Johnny May. Georgette Dover, MD, [facility] Surgery  General/ Trauma Surgery Beeper [phone]  04/11/2018 9:58 AM

## 2018-04-11 NOTE — H&P (View-Only) (Signed)
History of Present Illness Johnny May. Johnny Crace MD; 04/11/2018 9:49 AM) The patient is a 85 year old male who presents with a complaint of Mass. Referred by Dr. Reymundo Poll for "purple thigh mass"  This is a 85 year old male with dementia, but relatively good physiologic health who presents with a couple of months of firm subcutaneous masses in both thighs. He is a resident at [facility] but is fairly independent and active. He states that he has minimal discomfort in the thigh masses, but he has started to have some discomfort in his right groin. He brought this to the attention of Dr. Fredderick Phenix, who referred him to [place] for biopsy and management. No imaging of these areas.  He is accompanied by his son, who is his POA.   Past Surgical History [name], CMA; 04/11/2018 9:10 AM) No pertinent past surgical history  Diagnostic Studies History [name], CMA; 04/11/2018 9:10 AM) Colonoscopy >10 years ago  Allergies [name], CMA; 04/11/2018 9:10 AM) No Known Drug Allergies [04/11/2018]:  Medication History [name], CMA; 04/11/2018 9:10 AM) Citalopram Hydrobromide (10MG  Tablet, Oral) Active. Donepezil HCl (5MG  Tablet, Oral) Active. traZODone HCl (50MG  Tablet, Oral) Active. Aspirin (81MG  Tablet, Oral) Active. Medications Reconciled  Social History [name], CMA; 04/11/2018 9:10 AM) Alcohol use Remotely quit alcohol use. Caffeine use Coffee. No drug use Tobacco use Former smoker.  Family History [name], [place]; 04/11/2018 9:10 AM) Heart Disease Father.  Other Problems [name], CMA; 04/11/2018 9:10 AM) Anxiety Disorder Arthritis     Review of Systems ([name] CMA; 04/11/2018 9:10 AM) General Not Present- Appetite Loss, Chills, Fatigue, Fever, Night Sweats, Weight Gain and Weight Loss. Skin Present- New Lesions. Not Present- Change in Wart/Mole, Dryness, Hives, Jaundice, Non-Healing Wounds, Rash and Ulcer. HEENT Present- Hearing Loss.  Not Present- Earache, Hoarseness, Nose Bleed, Oral Ulcers, Ringing in the Ears, Seasonal Allergies, Sinus Pain, Sore Throat, Visual Disturbances, Wears glasses/contact lenses and Yellow Eyes. Respiratory Not Present- Bloody sputum, Chronic Cough, Difficulty Breathing, Snoring and Wheezing. Breast Not Present- Breast Mass, Breast Pain, Nipple Discharge and Skin Changes. Cardiovascular Not Present- Chest Pain, Difficulty Breathing Lying Down, Leg Cramps, Palpitations, Rapid Heart Rate, Shortness of Breath and Swelling of Extremities. Gastrointestinal Not Present- Abdominal Pain, Bloating, Bloody Stool, Change in Bowel Habits, Chronic diarrhea, Constipation, Difficulty Swallowing, Excessive gas, Gets full quickly at meals, Hemorrhoids, Indigestion, Nausea, Rectal Pain and Vomiting. Male Genitourinary Present- Frequency. Not Present- Blood in Urine, Change in Urinary Stream, Impotence, Nocturia, Painful Urination, Urgency and Urine Leakage. Musculoskeletal Present- Joint Stiffness. Not Present- Back Pain, Joint Pain, Muscle Pain, Muscle Weakness and Swelling of Extremities. Neurological Present- Decreased Memory. Not Present- Fainting, Headaches, Numbness, Seizures, Tingling, Tremor, Trouble walking and Weakness. Psychiatric Not Present- Anxiety, Bipolar, Change in Sleep Pattern, Depression, Fearful and Frequent crying. Endocrine Not Present- Cold Intolerance, Excessive Hunger, Hair Changes, Heat Intolerance, Hot flashes and New Diabetes. Hematology Not Present- Blood Thinners, Easy Bruising, Excessive bleeding, Gland problems, HIV and Persistent Infections.  Vitals ([name] CMA; 04/11/2018 9:10 AM) 04/11/2018 9:09 AM Weight: 169.8 lb Height: 70in Body Surface Area: 1.95 m Body Mass Index: 24.36 kg/m  Pulse: 72 (Regular)  BP: 120/70 (Sitting, Left Arm, Standard)      Physical Exam Rodman Key K. Elfego Giammarino MD; 04/11/2018 9:53 AM)  The physical exam findings are as follows: Note:Elderly WDWN  in NAD Eyes: Pupils equal, round; sclera anicteric HENT: Oral mucosa moist; good dentition Neck: No masses palpated, no thyromegaly Lungs: CTA bilaterally; normal respiratory  effort CV: Regular rate and rhythm; no murmurs; extremities well-perfused with no edema Abd: +bowel sounds, soft, non-tender, no palpable organomegaly; no palpable hernias Right groin - massively enlarged inguinal lymph nodes with some necrosis - open wound measuring about 3 x 2 cm with underlying necrotic tissue and odor. There are small distinct lymph nodes laterally No obvious left inguinal lymphadenopathy Right medial distal thigh - purplish clustered nodules in the subcutaneous tissues, firm, non-tender; small area beginning to turn black. no drainage Left lateral distal thigh - purplish clustered nodules in the subcutaneous tissues, firm, non-tender; no necrosis or drainage No obvious distal skin lesions Psychiatric - alert and oriented x 4; calm mood and affect    Assessment & Plan Rodman Key K. Harlee Eckroth MD; 04/11/2018 9:54 AM)  Percell Belt, INGUINAL (R59.0) Impression: Massive right inguinal lymphadenopathy - right groin with necrosis Bilateral thigh subcutaneous masses   Daily wet to dry dressings to right groin - 2x2 gauze moistened with saline, covered with dry gauze and tape  Current Plans Schedule for Surgery - Right groin lymph node biopsy. The surgical procedure has been discussed with the patient. Potential risks, benefits, alternative treatments, and expected outcomes have been explained. All of the patient's questions at this time have been answered. The likelihood of reaching the patient's treatment goal is good. The patient understand the proposed surgical procedure and wishes to proceed. Note:These physical findings are extremely worrisome for metastatic cancer. At this time the primary is unknown. The differential diagnosis includes lymphoma or metastatic melanoma. His management will be  complicated by his age and his dementia. I had a discussion privately with his son who is his healthcare power of attorney. We will first try to obtain some further information by getting a CT scan of the chest, abdomen, pelvis, and thighs. We will also perform an excisional lymph node biopsy of the small mobile subcutaneous node in the lateral right groin. This should provide tissue diagnosis for further discussion.  In the meantime, we will ask the nursing staff at [facility] to begin daily wet-to-dry dressings to the necrotic lymph node in the right groin.   Johnny May. Georgette Dover, MD, [facility] Surgery  General/ Trauma Surgery Beeper [phone]  04/11/2018 9:58 AM

## 2018-04-15 ENCOUNTER — Ambulatory Visit
Admission: RE | Admit: 2018-04-15 | Discharge: 2018-04-15 | Disposition: A | Discharge status: Home / Self Care | Payer: Medicare HMO | Source: Ambulatory Visit | Attending: Surgery | Admitting: Surgery

## 2018-04-15 DIAGNOSIS — R599 Enlarged lymph nodes, unspecified: Secondary | ICD-10-CM

## 2018-04-15 MED ORDER — IOPAMIDOL (ISOVUE-300) INJECTION 61%
100.0000 mL | Freq: Once | INTRAVENOUS | Status: AC | PRN
  Administered 2018-04-15: 100 mL via INTRAVENOUS

## 2018-04-22 ENCOUNTER — Other Ambulatory Visit (HOSPITAL_COMMUNITY): Payer: Self-pay

## 2018-04-25 NOTE — Pre-Procedure Instructions (Signed)
Johnny May  04/25/2018      [facility] - [place], [place] - [address] [place] [address] Phone: [phone] Fax: [phone]    Your procedure is scheduled on February 20th.  Report to [facility] Entrance "A" at 8:00 A.M.  Call this number if you have problems the morning of surgery:  [phone]   Remember:  Do not eat or drink after midnight.     Take these medicines the morning of surgery with A SIP OF WATER  Citalopram (Celexa)  Gabapentin (Neurontin)  Follow your surgeon's instructions on when to stop Asprin.  If no instructions were given by your surgeon then you will need to call the office to get those instructions.    7 days prior to surgery STOP taking any Aspirin (unless otherwise instructed by your surgeon), Aleve, Naproxen, Ibuprofen, Motrin, Advil, Goody's, BC's, all herbal medications, fish oil, and all vitamins.     Do not wear jewelry.  Do not wear lotions, powders, colognes, or deodorant.  Do not shave 48 hours prior to surgery.  Men may shave face and neck.  Do not bring valuables to the hospital.  [facility] is not responsible for any belongings or valuables.   [place]- Preparing For Surgery  Before surgery, you can play an important role. Because skin is not sterile, your skin needs to be as free of germs as possible. You can reduce the number of germs on your skin by washing with CHG (chlorahexidine gluconate) Soap before surgery.  CHG is an antiseptic cleaner which kills germs and bonds with the skin to continue killing germs even after washing.    Oral Hygiene is also important to reduce your risk of infection.  Remember - BRUSH YOUR TEETH THE MORNING OF SURGERY WITH YOUR REGULAR TOOTHPASTE  Please do not use if you have an allergy to CHG or antibacterial soaps. If your skin becomes reddened/irritated stop using the CHG.  Do not shave (including legs and underarms) for at least 48 hours  prior to first CHG shower. It is OK to shave your face.  Please follow these instructions carefully.   1. Shower the NIGHT BEFORE SURGERY and the MORNING OF SURGERY with CHG.   2. If you chose to wash your hair, wash your hair first as usual with your normal shampoo.  3. After you shampoo, rinse your hair and body thoroughly to remove the shampoo.  4. Use CHG as you would any other liquid soap. You can apply CHG directly to the skin and wash gently with a scrungie or a clean washcloth.   5. Apply the CHG Soap to your body ONLY FROM THE NECK DOWN.  Do not use on open wounds or open sores. Avoid contact with your eyes, ears, mouth and genitals (private parts). Wash Face and genitals (private parts)  with your normal soap.  6. Wash thoroughly, paying special attention to the area where your surgery will be performed.  7. Thoroughly rinse your body with warm water from the neck down.  8. DO NOT shower/wash with your normal soap after using and rinsing off the CHG Soap.  9. Pat yourself dry with a CLEAN TOWEL.  10. Wear CLEAN PAJAMAS to bed the night before surgery, wear comfortable clothes the morning of surgery  11. Place CLEAN SHEETS on your bed the night of your first shower and DO NOT SLEEP WITH PETS.   Day of Surgery:  Do  not apply any deodorants/lotions.  Please wear clean clothes to the hospital/surgery center.   Remember to brush your teeth WITH YOUR REGULAR TOOTHPASTE.   Contacts, dentures or bridgework may not be worn into surgery.  Leave your suitcase in the car.  After surgery it may be brought to your room.  For patients admitted to the hospital, discharge time will be determined by your treatment team.  Patients discharged the day of surgery will not be allowed to drive home.    Please read over the following fact sheets that you were given. Coughing and Deep Breathing and Surgical Site Infection Prevention

## 2018-04-26 ENCOUNTER — Encounter (HOSPITAL_COMMUNITY): Payer: Self-pay

## 2018-04-26 ENCOUNTER — Encounter (HOSPITAL_COMMUNITY)
Admission: RE | Admit: 2018-04-26 | Discharge: 2018-04-26 | Disposition: A | Discharge status: Home / Self Care | Payer: Medicare HMO | Source: Ambulatory Visit | Attending: Surgery | Admitting: Surgery

## 2018-04-26 ENCOUNTER — Other Ambulatory Visit: Payer: Self-pay

## 2018-04-26 DIAGNOSIS — M1990 Unspecified osteoarthritis, unspecified site: Secondary | ICD-10-CM | POA: Diagnosis not present

## 2018-04-26 DIAGNOSIS — I10 Essential (primary) hypertension: Secondary | ICD-10-CM

## 2018-04-26 DIAGNOSIS — R590 Localized enlarged lymph nodes: Secondary | ICD-10-CM | POA: Diagnosis present

## 2018-04-26 DIAGNOSIS — R9431 Abnormal electrocardiogram [ECG] [EKG]: Secondary | ICD-10-CM | POA: Insufficient documentation

## 2018-04-26 DIAGNOSIS — I4510 Unspecified right bundle-branch block: Secondary | ICD-10-CM | POA: Insufficient documentation

## 2018-04-26 DIAGNOSIS — Z7982 Long term (current) use of aspirin: Secondary | ICD-10-CM | POA: Diagnosis not present

## 2018-04-26 DIAGNOSIS — F0390 Unspecified dementia without behavioral disturbance: Secondary | ICD-10-CM | POA: Diagnosis not present

## 2018-04-26 DIAGNOSIS — Z79899 Other long term (current) drug therapy: Secondary | ICD-10-CM | POA: Diagnosis not present

## 2018-04-26 DIAGNOSIS — Z87891 Personal history of nicotine dependence: Secondary | ICD-10-CM | POA: Diagnosis not present

## 2018-04-26 DIAGNOSIS — C8515 Unspecified B-cell lymphoma, lymph nodes of inguinal region and lower limb: Secondary | ICD-10-CM | POA: Diagnosis not present

## 2018-04-26 DIAGNOSIS — Z01818 Encounter for other preprocedural examination: Secondary | ICD-10-CM | POA: Insufficient documentation

## 2018-04-26 LAB — BASIC METABOLIC PANEL
Anion gap: 8 (ref 5–15)
BUN: 18 mg/dL (ref 8–23)
CO2: 27 mmol/L (ref 22–32)
Calcium: 8.7 mg/dL — ABNORMAL LOW (ref 8.9–10.3)
Chloride: 105 mmol/L (ref 98–111)
Creatinine, Ser: 1.15 mg/dL (ref 0.61–1.24)
GFR calc Af Amer: >60 mL/min (ref >60)
GFR calc non Af Amer: 55 mL/min — ABNORMAL LOW (ref >60)
Glucose, Bld: 127 mg/dL — ABNORMAL HIGH (ref 70–99)
Potassium: 3.9 mmol/L (ref 3.5–5.1)
Sodium: 140 mmol/L (ref 135–145)

## 2018-04-26 LAB — CBC
HCT: 35.8 % — ABNORMAL LOW (ref 39.0–52.0)
Hemoglobin: 11.3 g/dL — ABNORMAL LOW (ref 13.0–17.0)
MCH: 29.2 pg (ref 26.0–34.0)
MCHC: 31.6 g/dL (ref 30.0–36.0)
MCV: 92.5 fL (ref 80.0–100.0)
Platelets: 242 10*3/uL (ref 150–400)
RBC: 3.87 MIL/uL — ABNORMAL LOW (ref 4.22–5.81)
RDW: 15.5 % (ref 11.5–15.5)
WBC: 8 10*3/uL (ref 4.0–10.5)
nRBC: 0 % (ref 0.0–0.2)

## 2018-04-26 NOTE — Progress Notes (Signed)
PCP - Reymundo Poll Cardiologist - denies  Chest x-ray - N/A EKG - 04-26-18  Aspirin Instructions: Pt instructed to stop taking ASA 81 mg today  Anesthesia review: yes, heart history Jeneen Rinks notified of abnormal EKG.   Patient denies shortness of breath, fever, cough and chest pain at PAT appointment   Patient verbalized understanding of instructions that were given to them at the PAT appointment. Patient was also instructed that they will need to review over the PAT instructions again at home before surgery.   Pt's son Johnny May is HCPOA.  Son to bring in DNR, living will, and [place] paperwork on DOS.

## 2018-04-27 NOTE — Anesthesia Preprocedure Evaluation (Addendum)
Anesthesia Evaluation  Patient identified by MRN, date of birth, ID band  Reviewed: Allergy & Precautions, NPO status , Patient's Chart, lab work & pertinent test results  History of Anesthesia Complications Negative for: history of anesthetic complications  Airway Mallampati: II  TM Distance: >3 FB Neck ROM: Full    Dental  (+) Dental Advisory Given, Missing,    Pulmonary former smoker,    Pulmonary exam normal breath sounds clear to auscultation       Cardiovascular hypertension, Normal cardiovascular exam Rhythm:Regular Rate:Normal  EKG 04/26/18: NSR, RBBB   Neuro/Psych Anxiety Dementia negative neurological ROS     GI/Hepatic negative GI ROS, Neg liver ROS,   Endo/Other  negative endocrine ROS  Renal/GU negative Renal ROS     Musculoskeletal  (+) Arthritis ,   Abdominal   Peds  Hematology  (+) anemia , Hgb 11.3   Anesthesia Other Findings Day of surgery medications reviewed with the patient.  Inguinal lymphadenopathy    Reproductive/Obstetrics                           Anesthesia Physical Anesthesia Plan  ASA: II  Anesthesia Plan: MAC   Post-op Pain Management:    Induction:   PONV Risk Score and Plan: 1 and Treatment may vary due to age or medical condition and Propofol infusion  Airway Management Planned: Natural Airway and Simple Face Mask  Additional Equipment:   Intra-op Plan:   Post-operative Plan:   Informed Consent: I have reviewed the patients History and Physical, chart, labs and discussed the procedure including the risks, benefits and alternatives for the proposed anesthesia with the patient or authorized representative who has indicated his/her understanding and acceptance.       Plan Discussed with: CRNA  Anesthesia Plan Comments: (Please do not give steroid or benzodiazepine.)       Anesthesia Quick Evaluation

## 2018-04-27 NOTE — Progress Notes (Signed)
Anesthesia Chart Review:  Case:  [id number] Date/Time:  04/28/18 0945   Procedure:  EXCISIONAL LYMPH NODE BIOPSY RIGHT GROIN ERAS PATHWAY (Right )   Anesthesia type:  Monitor Anesthesia Care   Pre-op diagnosis:  Right inguinal lymphadenopathy   Location:  [facility] ROOM 07 / [facility]   Surgeon:  Donnie Mesa, MD      DISCUSSION: Patient is a 85 year old male scheduled for the above procedure.  History includes former smoker (quit '68), HTN, HLD, memory loss.   Preoperative EKG and labs stable. He denied SOB, chest pain, cough, fever at PAT RN visit. Last ASA 04/26/18. If no acute changes then I would anticipate that he can proceed as planned. Anesthesiologist to evaluate on the day of surgery.   VS: BP (!) 153/51   Pulse 66   Temp 36.6 C   Resp 18   Ht 5\' 10"  (1.778 m)   Wt 76.2 kg   SpO2 98%   BMI 24.09 kg/m   PROVIDERS: Reymundo Poll, MD is PCP   LABS: Labs reviewed: Acceptable for surgery. (all labs ordered are listed, but only abnormal results are displayed)  Labs Reviewed  CBC - Abnormal; Notable for the following components:      Result Value   RBC 3.87 (*)    Hemoglobin 11.3 (*)    HCT 35.8 (*)    All other components within normal limits  BASIC METABOLIC PANEL - Abnormal; Notable for the following components:   Glucose, Bld 127 (*)    Calcium 8.7 (*)    GFR calc non Af Amer 55 (*)    All other components within normal limits     IMAGES: CT chest/abd/pelvis 04/15/18: IMPRESSION: 1. Large superficial, ulcerated right inguinal mass measuring up to 7.4 cm with additional irregular satellite superficial skin and subcutaneous nodularity more laterally along the right groin. Similar-appearing superficial nodular masses along the medial mid right thigh and lateral distal left thigh. Differential diagnosis includes melanoma, squamous cell carcinoma, or cutaneous lymphoma. Correlation with tissue sampling is recommended. 2. No lymphadenopathy in the chest, abdomen, or  pelvis. 3. 2.2 cm hypodense nodule in the thyroid isthmus. Consider further evaluation with thyroid ultrasound as clinically indicated given the patient's age. This follows ACR consensus guidelines: Managing Incidental Thyroid Nodules Detected on Imaging: White Paper of the ACR Incidental Thyroid Findings Committee. J Am Coll Radiol 2015; 12:143-150. 4. Central prostate gland hypertrophy with evidence of chronic bladder outlet obstruction. 5.  Aortic atherosclerosis (ICD10-I70.0).   EKG: 04/26/18: Normal sinus rhythm Right bundle branch block Abnormal ECG No significant change since last tracing [07/02/16] Confirmed by Sherren Mocha [phone]) on 04/26/2018 7:22:11 PM   CV: N/A  Past Medical History:  Diagnosis Date  . Anxiety   . BPH (benign prostatic hyperplasia)   . Hyperlipidemia   . Hypertension   . Memory loss   . Osteoarthritis   . RLS (restless legs syndrome)   . Senile purpura ([place])   . Vitamin B12 deficiency     Past Surgical History:  Procedure Laterality Date  . PROSTATE BIOPSY      MEDICATIONS: . aspirin EC 81 MG tablet  . citalopram (CELEXA) 10 MG tablet  . donepezil (ARICEPT) 5 MG tablet  . doxycycline (VIBRA-TABS) 100 MG tablet  . gabapentin (NEURONTIN) 300 MG capsule  . Melatonin 3 MG TABS  . mupirocin cream (BACTROBAN) 2 %  . QUEtiapine (SEROQUEL) 50 MG tablet  . traZODone (DESYREL) 50 MG tablet  . vitamin B-12 (  CYANOCOBALAMIN) 1000 MCG tablet   No current facility-administered medications for this encounter.     Myra Gianotti, PA-C Surgical Short Stay/Anesthesiology [facility] Phone [phone] [facility] Phone [phone] 04/27/2018 9:01 AM

## 2018-04-28 ENCOUNTER — Other Ambulatory Visit: Payer: Self-pay

## 2018-04-28 ENCOUNTER — Ambulatory Visit (HOSPITAL_COMMUNITY)
Admission: RE | Admit: 2018-04-28 | Discharge: 2018-04-28 | Disposition: A | Discharge status: Home / Self Care | Payer: Medicare HMO | Attending: Surgery | Admitting: Surgery

## 2018-04-28 ENCOUNTER — Encounter (HOSPITAL_COMMUNITY)
Admission: RE | Disposition: A | Discharge status: Home / Self Care | Payer: Self-pay | Source: Home / Self Care | Attending: Surgery

## 2018-04-28 ENCOUNTER — Ambulatory Visit (HOSPITAL_COMMUNITY): Payer: Medicare HMO | Admitting: Physician Assistant

## 2018-04-28 ENCOUNTER — Encounter (HOSPITAL_COMMUNITY): Payer: Self-pay | Admitting: *Deleted

## 2018-04-28 ENCOUNTER — Ambulatory Visit (HOSPITAL_COMMUNITY): Payer: Medicare HMO | Admitting: Anesthesiology

## 2018-04-28 DIAGNOSIS — Z7982 Long term (current) use of aspirin: Secondary | ICD-10-CM | POA: Insufficient documentation

## 2018-04-28 DIAGNOSIS — Z79899 Other long term (current) drug therapy: Secondary | ICD-10-CM | POA: Diagnosis not present

## 2018-04-28 DIAGNOSIS — Z87891 Personal history of nicotine dependence: Secondary | ICD-10-CM | POA: Insufficient documentation

## 2018-04-28 DIAGNOSIS — M1990 Unspecified osteoarthritis, unspecified site: Secondary | ICD-10-CM | POA: Insufficient documentation

## 2018-04-28 DIAGNOSIS — C8515 Unspecified B-cell lymphoma, lymph nodes of inguinal region and lower limb: Secondary | ICD-10-CM | POA: Insufficient documentation

## 2018-04-28 DIAGNOSIS — I10 Essential (primary) hypertension: Secondary | ICD-10-CM | POA: Insufficient documentation

## 2018-04-28 DIAGNOSIS — F0390 Unspecified dementia without behavioral disturbance: Secondary | ICD-10-CM | POA: Diagnosis not present

## 2018-04-28 HISTORY — PX: LYMPH NODE BIOPSY: SHX201

## 2018-04-28 SURGERY — LYMPH NODE BIOPSY
Anesthesia: Monitor Anesthesia Care | Laterality: Right

## 2018-04-28 MED ORDER — FENTANYL CITRATE (PF) 100 MCG/2ML IJ SOLN
INTRAMUSCULAR | Status: DC | PRN
  Administered 2018-04-28: 25 ug via INTRAVENOUS

## 2018-04-28 MED ORDER — ACETAMINOPHEN 10 MG/ML IV SOLN: 1000.0000 mg | Freq: Once | INTRAVENOUS | Status: DC | PRN

## 2018-04-28 MED ORDER — PROPOFOL 10 MG/ML IV BOLUS
INTRAVENOUS | Status: AC
  Filled 2018-04-28: qty 20

## 2018-04-28 MED ORDER — PROPOFOL 500 MG/50ML IV EMUL
INTRAVENOUS | Status: AC
  Filled 2018-04-28: qty 100

## 2018-04-28 MED ORDER — PROPOFOL 500 MG/50ML IV EMUL
INTRAVENOUS | Status: DC | PRN
  Administered 2018-04-28: 75 ug/kg/min via INTRAVENOUS

## 2018-04-28 MED ORDER — BUPIVACAINE HCL 0.5 % IJ SOLN
INTRAMUSCULAR | Status: AC
  Filled 2018-04-28: qty 1

## 2018-04-28 MED ORDER — CEFAZOLIN SODIUM-DEXTROSE 2-4 GM/100ML-% IV SOLN
2.0000 g | INTRAVENOUS | Status: AC
  Administered 2018-04-28: 2 g via INTRAVENOUS

## 2018-04-28 MED ORDER — CHLORHEXIDINE GLUCONATE CLOTH 2 % EX PADS: 6.0000 | MEDICATED_PAD | Freq: Once | CUTANEOUS | Status: DC

## 2018-04-28 MED ORDER — LIDOCAINE HCL (CARDIAC) PF 100 MG/5ML IV SOSY
PREFILLED_SYRINGE | INTRAVENOUS | Status: DC | PRN
  Administered 2018-04-28: 30 mg via INTRAVENOUS

## 2018-04-28 MED ORDER — BUPIVACAINE HCL (PF) 0.5 % IJ SOLN
INTRAMUSCULAR | Status: DC | PRN
  Administered 2018-04-28: 10 mL

## 2018-04-28 MED ORDER — LACTATED RINGERS IV SOLN
INTRAVENOUS | Status: DC | PRN
  Administered 2018-04-28: 09:00:00 via INTRAVENOUS

## 2018-04-28 MED ORDER — FENTANYL CITRATE (PF) 250 MCG/5ML IJ SOLN
INTRAMUSCULAR | Status: AC
  Filled 2018-04-28: qty 5

## 2018-04-28 MED ORDER — FENTANYL CITRATE (PF) 100 MCG/2ML IJ SOLN: 25.0000 ug | INTRAMUSCULAR | Status: DC | PRN

## 2018-04-28 MED ORDER — CEFAZOLIN SODIUM-DEXTROSE 2-4 GM/100ML-% IV SOLN
INTRAVENOUS | Status: AC
  Filled 2018-04-28: qty 100

## 2018-04-28 MED ORDER — LACTATED RINGERS IV SOLN: INTRAVENOUS | Status: DC

## 2018-04-28 MED ORDER — 0.9 % SODIUM CHLORIDE (POUR BTL) OPTIME
TOPICAL | Status: DC | PRN
  Administered 2018-04-28: 1000 mL

## 2018-04-28 SURGICAL SUPPLY — 42 items
BENZOIN TINCTURE PRP APPL 2/3 (GAUZE/BANDAGES/DRESSINGS) ×3 IMPLANT
BLADE CLIPPER SURG (BLADE) ×3 IMPLANT
CLOSURE WOUND 1/2 X4 (GAUZE/BANDAGES/DRESSINGS) ×1
CONT SPEC 4OZ CLIKSEAL STRL BL (MISCELLANEOUS) ×3 IMPLANT
COVER SURGICAL LIGHT HANDLE (MISCELLANEOUS) ×3 IMPLANT
COVER TRANSDUCER ULTRASND GEL (DRAPE) ×3 IMPLANT
COVER WAND RF STERILE (DRAPES) ×3 IMPLANT
DECANTER SPIKE VIAL GLASS SM (MISCELLANEOUS) ×3 IMPLANT
DERMABOND ADVANCED (GAUZE/BANDAGES/DRESSINGS) ×2
DERMABOND ADVANCED .7 DNX12 (GAUZE/BANDAGES/DRESSINGS) ×1 IMPLANT
DRAPE LAPAROTOMY T 98X78 PEDS (DRAPES) ×3 IMPLANT
DRAPE UTILITY XL STRL (DRAPES) ×3 IMPLANT
ELECT REM PT RETURN 9FT ADLT (ELECTROSURGICAL) ×3
ELECTRODE REM PT RTRN 9FT ADLT (ELECTROSURGICAL) ×1 IMPLANT
GAUZE SPONGE 4X4 12PLY STRL (GAUZE/BANDAGES/DRESSINGS) ×3 IMPLANT
GAUZE SPONGE 4X4 12PLY STRL LF (GAUZE/BANDAGES/DRESSINGS) ×3 IMPLANT
GLOVE BIO SURGEON STRL SZ7 (GLOVE) ×3 IMPLANT
GLOVE BIOGEL PI IND STRL 6.5 (GLOVE) ×1 IMPLANT
GLOVE BIOGEL PI IND STRL 7.0 (GLOVE) IMPLANT
GLOVE BIOGEL PI IND STRL 7.5 (GLOVE) ×1 IMPLANT
GLOVE BIOGEL PI INDICATOR 6.5 (GLOVE) ×2
GLOVE BIOGEL PI INDICATOR 7.0 (GLOVE)
GLOVE BIOGEL PI INDICATOR 7.5 (GLOVE) ×2
GOWN STRL REUS W/ TWL LRG LVL3 (GOWN DISPOSABLE) ×2 IMPLANT
GOWN STRL REUS W/TWL LRG LVL3 (GOWN DISPOSABLE) ×4
KIT BASIN OR (CUSTOM PROCEDURE TRAY) ×3 IMPLANT
KIT TURNOVER KIT B (KITS) ×3 IMPLANT
NEEDLE HYPO 25GX1X1/2 BEV (NEEDLE) ×3 IMPLANT
NS IRRIG 1000ML POUR BTL (IV SOLUTION) ×3 IMPLANT
PACK GENERAL/GYN (CUSTOM PROCEDURE TRAY) ×3 IMPLANT
PAD ARMBOARD 7.5X6 YLW CONV (MISCELLANEOUS) ×6 IMPLANT
PENCIL SMOKE EVACUATOR (MISCELLANEOUS) IMPLANT
SPECIMEN JAR MEDIUM (MISCELLANEOUS) ×3 IMPLANT
SPONGE LAP 18X18 RF (DISPOSABLE) ×3 IMPLANT
STRIP CLOSURE SKIN 1/2X4 (GAUZE/BANDAGES/DRESSINGS) ×2 IMPLANT
SUT MNCRL AB 4-0 PS2 18 (SUTURE) ×3 IMPLANT
SUT VIC AB 3-0 SH 27 (SUTURE) ×2
SUT VIC AB 3-0 SH 27XBRD (SUTURE) ×1 IMPLANT
SYR CONTROL 10ML LL (SYRINGE) ×3 IMPLANT
TAPE CLOTH SURG 4X10 WHT LF (GAUZE/BANDAGES/DRESSINGS) ×3 IMPLANT
TOWEL OR 17X24 6PK STRL BLUE (TOWEL DISPOSABLE) ×3 IMPLANT
TOWEL OR 17X26 10 PK STRL BLUE (TOWEL DISPOSABLE) ×3 IMPLANT

## 2018-04-28 NOTE — Transfer of Care (Signed)
Immediate Anesthesia Transfer of Care Note  Patient: Johnny May  Procedure(s) Performed: EXCISIONAL LYMPH NODE BIOPSY RIGHT GROIN ERAS PATHWAY (Right )  Patient Location: PACU  Anesthesia Type:MAC  Level of Consciousness: drowsy  Airway & Oxygen Therapy: Patient Spontanous Breathing and Patient connected to nasal cannula oxygen  Post-op Assessment: Report given to RN and Post -op Vital signs reviewed and stable  Post vital signs: Reviewed and stable  Last Vitals:  Vitals Value Taken Time  BP 123/56 04/28/2018 10:33 AM  Temp    Pulse 51 04/28/2018 10:38 AM  Resp 16 04/28/2018 10:38 AM  SpO2 95 % 04/28/2018 10:38 AM  Vitals shown include unvalidated device data.  Last Pain:  Vitals:   04/28/18 0807  TempSrc: Oral         Complications: No apparent anesthesia complications

## 2018-04-28 NOTE — Interval H&P Note (Signed)
History and Physical Interval Note:  04/28/2018 9:06 AM  Johnny May  has presented today for surgery, with the diagnosis of Right inguinal lymphadenopathy  The various methods of treatment have been discussed with the patient and family. After consideration of risks, benefits and other options for treatment, the patient has consented to  Procedure(s): EXCISIONAL LYMPH NODE BIOPSY RIGHT [place] (Right) as a surgical intervention .  The patient's history has been reviewed, patient examined, no change in status, stable for surgery.  I have reviewed the patient's chart and labs.  Questions were answered to the patient's satisfaction.     Maia Petties

## 2018-04-28 NOTE — Op Note (Signed)
Preop diagnosis: Massive right inguinal lymphadenopathy Postop diagnosis: Same Procedure performed: Right inguinal excisional lymph node biopsy Surgeon:Moustafa Mossa K Ziana Heyliger Anesthesia: Local MAC Indications:  This is a 85 year old male with dementia, but relatively good physiologic health who presents with a couple of months of firm subcutaneous masses in both thighs. He is a resident at [facility] but is fairly independent and active. He states that he has minimal discomfort in the thigh masses, but he has started to have some discomfort in his right groin. He was examined and was noted to have massive right inguinal lymphadenopathy with some necrosis through the skin.  He has a 2 cm palpable lymph node that is more lateral to the necrotic lymph node.  Staging CT scan showed no significant lymphadenopathy in the chest abdomen and remainder of the pelvis.  He presents now for excisional biopsy for diagnosis.  Description of procedure: The patient is brought to the operating room and placed in the supine position on the operating room table.  He was given some intravenous sedation.  His right groin was then prepped with Betadine and draped in sterile fashion.  A timeout was taken.  We anesthetized the area over the palpable 2 cm lymph node with 0.5% Marcaine.  I made an incision over this area.  We dissected down through the dermis.  We encountered a large necrotic lymph node.  I excised a large amount of tissue and sent this for frozen section.  We inspected for hemostasis.  The wound was thoroughly irrigated.  We closed with a deep layer of 3-0 Vicryl and a subcuticular layer 4-0 Monocryl.  Dermabond was applied.  We then irrigated the necrotic wound more medially.  This wound was dressed with a saline wet-to-dry dressing.  Frozen section revealed that this mass indeed was lymph node tissue.  This was be sent for further testing to hopefully provide a diagnosis.  The patient was then awakened and  moved to the recovery room in stable condition.  All sponge, instrument, and needle counts are correct.  Imogene Burn. Georgette Dover, MD, [facility]  General/ Trauma Surgery Beeper [phone]  04/28/2018 10:29 AM

## 2018-04-28 NOTE — Anesthesia Procedure Notes (Signed)
Procedure Name: MAC Date/Time: 04/28/2018 9:32 AM Performed by: Eligha Bridegroom, CRNA Pre-anesthesia Checklist: Patient identified, Suction available, Patient being monitored, Emergency Drugs available and Timeout performed Patient Re-evaluated:Patient Re-evaluated prior to induction Oxygen Delivery Method: Nasal cannula Preoxygenation: Pre-oxygenation with 100% oxygen Induction Type: IV induction

## 2018-04-28 NOTE — Discharge Instructions (Signed)
[place] Office Phone Number [phone]  POST OP INSTRUCTIONS  Always review your discharge instruction sheet given to you by the facility where your surgery was performed.  IF YOU HAVE DISABILITY OR FAMILY LEAVE FORMS, YOU MUST BRING THEM TO THE OFFICE FOR PROCESSING.  DO NOT GIVE THEM TO YOUR DOCTOR.  1. You may take acetaminophen (Tylenol) or ibuprofen (Advil) as needed. 2. Take your usually prescribed medications unless otherwise directed 3. Resume your normal diet the day after surgery. 4. Most patients will experience some swelling and bruising around the surgical site.  Ice packs will help.  Swelling and bruising can take several days to resolve.  5. You may remove your bandages 24 hours after surgery, and you may shower at that time. Resume daily wet to dry dressings to the larger necrotic wound. 6. ACTIVITIES:  You may resume regular daily activities (gradually increasing) beginning the next day.   a.  7. You should see your doctor in the office for a follow-up appointment approximately two to three weeks after your surgery.    WHEN TO CALL YOUR DOCTOR: 1. Fever over 101.0 2. Nausea and/or vomiting. 3. Extreme swelling or bruising. 4. Continued bleeding from incision. 5. Increased pain, redness, or drainage from the incision.  The clinic staff is available to answer your questions during regular business hours.  Please dont hesitate to call and ask to speak to one of the nurses for clinical concerns.  If you have a medical emergency, go to the nearest emergency room or call 911.  A surgeon from [facility] is always on call at the hospital.  For further questions, please visit [email or web address]

## 2018-04-28 NOTE — Anesthesia Postprocedure Evaluation (Signed)
Anesthesia Post Note  Patient: Johnny May  Procedure(s) Performed: EXCISIONAL LYMPH NODE BIOPSY RIGHT GROIN ERAS PATHWAY (Right )     Patient location during evaluation: PACU Anesthesia Type: MAC Level of consciousness: awake and alert Pain management: pain level controlled Vital Signs Assessment: post-procedure vital signs reviewed and stable Respiratory status: spontaneous breathing, nonlabored ventilation and respiratory function stable Cardiovascular status: blood pressure returned to baseline and stable Postop Assessment: no apparent nausea or vomiting Anesthetic complications: no    Last Vitals:  Vitals:   04/28/18 1100 04/28/18 1110  BP: 134/65 112/72  Pulse: (!) 55 (!) 59  Resp: 18 17  Temp:    SpO2: 98% 98%    Last Pain:  Vitals:   04/28/18 1110  TempSrc:   PainSc: 0-No pain                 Brennan Bailey

## 2018-04-29 ENCOUNTER — Encounter (HOSPITAL_COMMUNITY): Payer: Self-pay | Admitting: Surgery

## 2018-05-09 ENCOUNTER — Telehealth: Payer: Self-pay | Admitting: Hematology

## 2018-05-09 NOTE — Telephone Encounter (Signed)
A new patient appt has been scheduled for the pt to see Dr. Irene Limbo on 3/4 at 2pm. I cld the pt's son and lft the appt date and time on his phone. Pt is a resident at [facility] I attempted to call and provide the appt date and time, but was told I had to notify the family.

## 2018-05-11 ENCOUNTER — Telehealth: Payer: Self-pay | Admitting: Hematology

## 2018-05-11 ENCOUNTER — Inpatient Hospital Stay: Payer: Medicare HMO | Attending: Hematology | Admitting: Hematology

## 2018-05-11 ENCOUNTER — Telehealth: Payer: Self-pay | Admitting: Radiation Oncology

## 2018-05-11 VITALS — BP 128/67 | HR 74 | Temp 97.9°F | Resp 18 | Ht 70.0 in | Wt 167.6 lb

## 2018-05-11 DIAGNOSIS — E538 Deficiency of other specified B group vitamins: Secondary | ICD-10-CM | POA: Insufficient documentation

## 2018-05-11 DIAGNOSIS — I10 Essential (primary) hypertension: Secondary | ICD-10-CM | POA: Insufficient documentation

## 2018-05-11 DIAGNOSIS — N400 Enlarged prostate without lower urinary tract symptoms: Secondary | ICD-10-CM | POA: Diagnosis not present

## 2018-05-11 DIAGNOSIS — Z87891 Personal history of nicotine dependence: Secondary | ICD-10-CM | POA: Diagnosis not present

## 2018-05-11 DIAGNOSIS — F419 Anxiety disorder, unspecified: Secondary | ICD-10-CM

## 2018-05-11 DIAGNOSIS — R413 Other amnesia: Secondary | ICD-10-CM | POA: Insufficient documentation

## 2018-05-11 DIAGNOSIS — E785 Hyperlipidemia, unspecified: Secondary | ICD-10-CM | POA: Insufficient documentation

## 2018-05-11 DIAGNOSIS — C8335 Diffuse large B-cell lymphoma, lymph nodes of inguinal region and lower limb: Secondary | ICD-10-CM | POA: Insufficient documentation

## 2018-05-11 DIAGNOSIS — Z79899 Other long term (current) drug therapy: Secondary | ICD-10-CM | POA: Diagnosis not present

## 2018-05-11 DIAGNOSIS — M1990 Unspecified osteoarthritis, unspecified site: Secondary | ICD-10-CM | POA: Diagnosis not present

## 2018-05-11 MED ORDER — METRONIDAZOLE 1 % EX GEL: Freq: Two times a day (BID) | CUTANEOUS | 1 refills | Status: DC

## 2018-05-11 NOTE — Progress Notes (Signed)
HEMATOLOGY/ONCOLOGY CONSULTATION NOTE  Date of Service: 05/11/2018  Patient Care Team: Reymundo Poll, MD as PCP - General (Family Medicine)  CHIEF COMPLAINTS/PURPOSE OF CONSULTATION:  Diffuse Large B-Cell Lymphoma  HISTORY OF PRESENTING ILLNESS:   Johnny May is a wonderful 85 y.o. male who has been referred to [place] by Dr. Reymundo Poll for evaluation and management of Diffuse Large B-Cell Lymphoma. He is accompanied today by his son. The pt reports that he is doing well overall.   The pt reports that he has had a "questionable spot" in his right groin which first appeared about a year ago, and hasn't changed in the last 6 months. The pt's son notes that the timeline may be closer to 4 months, as opposed to a year. The pt denies noticing any other spots on his legs, however the pt's son notes that he has observed several areas on the patient's thighs. The pt notes that his right inguinal spot has itched but has not hurt. The pt denies any leg swelling or abdominal pains.  The pt denies his health changing any in the last year and denies fevers, chills, or night sweats.. He notes that he has been eating well, and denies unexpected weight loss.  The pt notes that he lives in an assisted living, where he has been for 2 years. He is able to ambulate under his own strength. The pt notes that his meals are provided and his laundry is done for him.   The pt's son notes that there have been "big changes cognitively," and is in level 1 care. The pt's son notes that he observes cognitive changes "every couple weeks." The pt's son notes that the pt desires a conservative approach, and they have been able to discuss this together recently.  The pt notes that he enjoys "good physical health," and enjoys his life very much and being social with many friends at his assisted living facility.   Of note prior to the patient's visit today, pt has had CT C/A/P/LE completed on 04/26/18 with results revealing  Large superficial, ulcerated right inguinal mass measuringup to 7.4 cm with additional irregular satellite superficial skin and subcutaneous nodularity more laterally along the right groin. Similar-appearing superficial nodular masses along the medial mid right thigh and lateral distal left thigh. Differential diagnosis includes melanoma, squamous cell carcinoma, or cutaneous lymphoma. Correlation with tissue sampling is recommended. 2. No lymphadenopathy in the chest, abdomen, or pelvis. 3. 2.2 cm hypodense nodule in the thyroid isthmus. Consider further evaluation with thyroid ultrasound as clinically indicated given the patient's age. 4. Central prostate gland hypertrophy with evidence of chronic bladder outlet obstruction. 5.  Aortic atherosclerosis.   Most recent lab results (04/26/18) of CBC and BMP is as follows: all values are WNL except for RBC at 3.87, HGB at 11.3, HCT at 35.8, Glucose at 127, Calcium at 8.7, GFR at 55.  On review of systems, pt reports good energy levels, eating well, itchy area in right groin, and denies leg swelling, noticing other lumps or bumps, abdominal pains, fevers, chills, night sweats, and any other symptoms.  MEDICAL HISTORY:  Past Medical History:  Diagnosis Date   Anxiety    BPH (benign prostatic hyperplasia)    Hyperlipidemia    Hypertension    Memory loss    Osteoarthritis    RLS (restless legs syndrome)    Senile purpura (HCC)    Vitamin B12 deficiency     SURGICAL HISTORY: Past Surgical History:  Procedure  Laterality Date   LYMPH NODE BIOPSY Right 04/28/2018   Procedure: EXCISIONAL LYMPH NODE BIOPSY RIGHT GROIN ERAS PATHWAY;  Surgeon: Donnie Mesa, MD;  Location: [facility];  Service: General;  Laterality: Right;   PROSTATE BIOPSY      SOCIAL HISTORY: Social History   Socioeconomic History   Marital status: Widowed    Spouse name: Not on file   Number of children: Not on file   Years of education: Not on file   Highest  education level: Not on file  Occupational History   Not on file  Social Needs   Financial resource strain: Not on file   Food insecurity:    Worry: Not on file    Inability: Not on file   Transportation needs:    Medical: Not on file    Non-medical: Not on file  Tobacco Use   Smoking status: Former Smoker    Packs/day: 1.00    Last attempt to quit: 03/09/1966    Years since quitting: 52.2   Smokeless tobacco: Never Used   Tobacco comment: 50 years ago quit  Substance and Sexual Activity   Alcohol use: No   Drug use: No   Sexual activity: Not on file  Lifestyle   Physical activity:    Days per week: Not on file    Minutes per session: Not on file   Stress: Not on file  Relationships   Social connections:    Talks on phone: Not on file    Gets together: Not on file    Attends religious service: Not on file    Active member of club or organization: Not on file    Attends meetings of clubs or organizations: Not on file    Relationship status: Not on file   Intimate partner violence:    Fear of current or ex partner: Not on file    Emotionally abused: Not on file    Physically abused: Not on file    Forced sexual activity: Not on file  Other Topics Concern   Not on file  Social History Narrative   Pt lives at home alone. Widow as of 2018.  One son. Retired.     Caffeine 1 drink daily.  Is driving.  Education HS.   2 cups coffee daily.    FAMILY HISTORY: Family History  Problem Relation Age of Onset   Heart disease Mother    Heart disease Father     ALLERGIES:  is allergic to penicillins.  MEDICATIONS:  Current Outpatient Medications  Medication Sig Dispense Refill   aspirin EC 81 MG tablet Take 162 mg by mouth daily.      citalopram (CELEXA) 10 MG tablet Take 1 tablet (10 mg total) by mouth daily. 30 tablet 0   donepezil (ARICEPT) 5 MG tablet Take 5 mg by mouth at bedtime.     gabapentin (NEURONTIN) 300 MG capsule Take 300 mg by mouth at  bedtime.     Melatonin 3 MG TABS Take 3 mg by mouth at bedtime.     metroNIDAZOLE (METROGEL) 1 % gel Apply topically 2 (two) times daily. To rt groin wound/ulcer 45 g 1   traZODone (DESYREL) 50 MG tablet Take 25 mg by mouth at bedtime.     vitamin B-12 (CYANOCOBALAMIN) 1000 MCG tablet Take 1,000 mcg by mouth daily.     No current facility-administered medications for this visit.     REVIEW OF SYSTEMS:    10 Point review of Systems was done  is negative except as noted above.  PHYSICAL EXAMINATION: ECOG PERFORMANCE STATUS: 3 - Symptomatic, >50% confined to bed  . Vitals:   05/11/18 1407  BP: 128/67  Pulse: 74  Resp: 18  Temp: 97.9 F (36.6 C)  SpO2: 100%   Filed Weights   05/11/18 1407  Weight: 167 lb 9.6 oz (76 kg)   .Body mass index is 24.05 kg/m.  GENERAL:alert, in no acute distress and comfortable SKIN: Large 7-10cm right groin mass with a 2-3cm ulcer with necrotic slough at its base with significant foul odor EYES: conjunctiva are pink and non-injected, sclera anicteric OROPHARYNX: MMM, no exudates, no oropharyngeal erythema or ulceration NECK: supple, no JVD LYMPH:  no palpable lymphadenopathy in the cervical, axillary or inguinal regions LUNGS: clear to auscultation b/l with normal respiratory effort HEART: regular rate & rhythm ABDOMEN:  normoactive bowel sounds , non tender, not distended. Extremity: no pedal edema PSYCH: alert & oriented x 3 with fluent speech NEURO: no focal motor/sensory deficits  LABORATORY DATA:  I have reviewed the data as listed  . CBC Latest Ref Rng & Units 04/26/2018 07/01/2016  WBC 4.0 - 10.5 K/uL 8.0 9.0  Hemoglobin 13.0 - 17.0 g/dL 11.3(L) 11.4(L)  Hematocrit 39.0 - 52.0 % 35.8(L) 34.6(L)  Platelets 150 - 400 K/uL 242 239    . CMP Latest Ref Rng & Units 04/26/2018 07/01/2016  Glucose 70 - 99 mg/dL 127(H) 96  BUN 8 - 23 mg/dL 18 16  Creatinine 0.61 - 1.24 mg/dL 1.15 1.05  Sodium 135 - 145 mmol/L 140 140  Potassium 3.5 -  5.1 mmol/L 3.9 3.3(L)  Chloride 98 - 111 mmol/L 105 105  CO2 22 - 32 mmol/L 27 28  Calcium 8.9 - 10.3 mg/dL 8.7(L) 8.7(L)  Total Protein 6.5 - 8.1 g/dL - 6.8  Total Bilirubin 0.3 - 1.2 mg/dL - 0.4  Alkaline Phos 38 - 126 U/L - 54  AST 15 - 41 U/L - 20  ALT 17 - 63 U/L - 13(L)   .No results found for: LDH      RADIOGRAPHIC STUDIES: I have personally reviewed the radiological images as listed and agreed with the findings in the report. Ct Chest W Contrast  Result Date: 04/15/2018 CLINICAL DATA:  Bilateral thigh and right inguinal subcutaneous masses. EXAM: CT CHEST, ABDOMEN, AND PELVIS WITH CONTRAST CT OF THE LOWER BILATERAL EXTREMITY WITH CONTRAST TECHNIQUE: Multidetector CT imaging of the chest, abdomen, pelvis, and lower bilateral extremity was performed following the standard protocol during bolus administration of intravenous contrast. CONTRAST:  184mL ISOVUE-300 IOPAMIDOL (ISOVUE-300) INJECTION 61% Creatinine was obtained on site at [facility] at [address] Results: Creatinine 1.0 mg/dL. COMPARISON:  None. FINDINGS: CT CHEST FINDINGS Cardiovascular: Normal heart size. No pericardial effusion. No thoracic aortic aneurysm or dissection. Coronary, aortic arch, and branch vessel atherosclerotic vascular disease. No central pulmonary embolism. Mediastinum/Nodes: No enlarged mediastinal, hilar, or axillary lymph nodes. 2.2 cm hypodense nodule in the thyroid isthmus. The trachea and esophagus demonstrate no significant findings. Lungs/Pleura: Biapical pleuroparenchymal scarring, greater on the right. Scarring in the anterior right upper lobe. No suspicious pulmonary nodule. No focal consolidation, pleural effusion, or pneumothorax. Musculoskeletal: No chest wall mass or suspicious bone lesions identified. CT ABDOMEN PELVIS FINDINGS Hepatobiliary: No focal liver abnormality is seen. No gallstones, gallbladder wall thickening, or biliary dilatation. Pancreas: Unremarkable. No  pancreatic ductal dilatation or surrounding inflammatory changes. Spleen: Normal in size without focal abnormality. Adrenals/Urinary Tract: The adrenal glands are unremarkable. Subcentimeter low-density lesion  in the right kidney is too small to characterize. No renal or ureteral calculi. No hydronephrosis. The bladder is largely decompressed. Bladder wall trabeculation with a few small diverticula. Stomach/Bowel: Stomach is within normal limits. Appendix appears normal. No evidence of bowel wall thickening, distention, or inflammatory changes. Extensive left-sided colonic diverticulosis. Vascular/Lymphatic: Aortic atherosclerosis. No enlarged abdominal or pelvic lymph nodes. Reproductive: Prostate central gland hypertrophy indenting the bladder base. Other: No abdominal wall hernia or abnormality. No abdominopelvic ascites. No pneumoperitoneum. Musculoskeletal: No acute or significant osseous findings. CT LOWER BILATERAL EXTREMITY FINDINGS Bones/Joint/Cartilage No acute fracture or dislocation. No osseous destruction or periosteal reaction. Mild bilateral hip and knee osteoarthritis. Muscles and Tendons No muscle atrophy. Soft tissues Large superficial, ulcerated right inguinal mass measuring 3.2 x 7.4 x 7.2 cm. Additional irregular superficial skin nodularity more laterally along the right groin. These appears separate from the underlying normal appearing inguinal lymph nodes. Similar appearing 4.5 x 1.6 x 4.4 cm superficial nodular mass along the medial mid right thigh. There is no involvement of the underlying deep fascia. Similar appearing 4.2 x 1.0 x 6.4 cm superficial nodular mass along the lateral distal left thigh. The mass contacts the underlying the fascia overlying the distal vastus lateralis muscle. IMPRESSION: 1. Large superficial, ulcerated right inguinal mass measuring up to 7.4 cm with additional irregular satellite superficial skin and subcutaneous nodularity more laterally along the right groin.  Similar-appearing superficial nodular masses along the medial mid right thigh and lateral distal left thigh. Differential diagnosis includes melanoma, squamous cell carcinoma, or cutaneous lymphoma. Correlation with tissue sampling is recommended. 2. No lymphadenopathy in the chest, abdomen, or pelvis. 3. 2.2 cm hypodense nodule in the thyroid isthmus. Consider further evaluation with thyroid ultrasound as clinically indicated given the patient's age. This follows ACR consensus guidelines: Managing Incidental Thyroid Nodules Detected on Imaging: White Paper of the ACR Incidental Thyroid Findings Committee. J Am Coll Radiol 2015; 12:143-150. 4. Central prostate gland hypertrophy with evidence of chronic bladder outlet obstruction. 5.  Aortic atherosclerosis (ICD10-I70.0). Electronically Signed   By: Titus Dubin M.D.   On: 04/15/2018 15:33   Ct Abdomen Pelvis W Contrast  Result Date: 04/15/2018 CLINICAL DATA:  Bilateral thigh and right inguinal subcutaneous masses. EXAM: CT CHEST, ABDOMEN, AND PELVIS WITH CONTRAST CT OF THE LOWER BILATERAL EXTREMITY WITH CONTRAST TECHNIQUE: Multidetector CT imaging of the chest, abdomen, pelvis, and lower bilateral extremity was performed following the standard protocol during bolus administration of intravenous contrast. CONTRAST:  178mL ISOVUE-300 IOPAMIDOL (ISOVUE-300) INJECTION 61% Creatinine was obtained on site at [facility] at [address] Results: Creatinine 1.0 mg/dL. COMPARISON:  None. FINDINGS: CT CHEST FINDINGS Cardiovascular: Normal heart size. No pericardial effusion. No thoracic aortic aneurysm or dissection. Coronary, aortic arch, and branch vessel atherosclerotic vascular disease. No central pulmonary embolism. Mediastinum/Nodes: No enlarged mediastinal, hilar, or axillary lymph nodes. 2.2 cm hypodense nodule in the thyroid isthmus. The trachea and esophagus demonstrate no significant findings. Lungs/Pleura: Biapical pleuroparenchymal scarring,  greater on the right. Scarring in the anterior right upper lobe. No suspicious pulmonary nodule. No focal consolidation, pleural effusion, or pneumothorax. Musculoskeletal: No chest wall mass or suspicious bone lesions identified. CT ABDOMEN PELVIS FINDINGS Hepatobiliary: No focal liver abnormality is seen. No gallstones, gallbladder wall thickening, or biliary dilatation. Pancreas: Unremarkable. No pancreatic ductal dilatation or surrounding inflammatory changes. Spleen: Normal in size without focal abnormality. Adrenals/Urinary Tract: The adrenal glands are unremarkable. Subcentimeter low-density lesion in the right kidney is too small to characterize. No renal  or ureteral calculi. No hydronephrosis. The bladder is largely decompressed. Bladder wall trabeculation with a few small diverticula. Stomach/Bowel: Stomach is within normal limits. Appendix appears normal. No evidence of bowel wall thickening, distention, or inflammatory changes. Extensive left-sided colonic diverticulosis. Vascular/Lymphatic: Aortic atherosclerosis. No enlarged abdominal or pelvic lymph nodes. Reproductive: Prostate central gland hypertrophy indenting the bladder base. Other: No abdominal wall hernia or abnormality. No abdominopelvic ascites. No pneumoperitoneum. Musculoskeletal: No acute or significant osseous findings. CT LOWER BILATERAL EXTREMITY FINDINGS Bones/Joint/Cartilage No acute fracture or dislocation. No osseous destruction or periosteal reaction. Mild bilateral hip and knee osteoarthritis. Muscles and Tendons No muscle atrophy. Soft tissues Large superficial, ulcerated right inguinal mass measuring 3.2 x 7.4 x 7.2 cm. Additional irregular superficial skin nodularity more laterally along the right groin. These appears separate from the underlying normal appearing inguinal lymph nodes. Similar appearing 4.5 x 1.6 x 4.4 cm superficial nodular mass along the medial mid right thigh. There is no involvement of the underlying deep  fascia. Similar appearing 4.2 x 1.0 x 6.4 cm superficial nodular mass along the lateral distal left thigh. The mass contacts the underlying the fascia overlying the distal vastus lateralis muscle. IMPRESSION: 1. Large superficial, ulcerated right inguinal mass measuring up to 7.4 cm with additional irregular satellite superficial skin and subcutaneous nodularity more laterally along the right groin. Similar-appearing superficial nodular masses along the medial mid right thigh and lateral distal left thigh. Differential diagnosis includes melanoma, squamous cell carcinoma, or cutaneous lymphoma. Correlation with tissue sampling is recommended. 2. No lymphadenopathy in the chest, abdomen, or pelvis. 3. 2.2 cm hypodense nodule in the thyroid isthmus. Consider further evaluation with thyroid ultrasound as clinically indicated given the patient's age. This follows ACR consensus guidelines: Managing Incidental Thyroid Nodules Detected on Imaging: White Paper of the ACR Incidental Thyroid Findings Committee. J Am Coll Radiol 2015; 12:143-150. 4. Central prostate gland hypertrophy with evidence of chronic bladder outlet obstruction. 5.  Aortic atherosclerosis (ICD10-I70.0). Electronically Signed   By: Titus Dubin M.D.   On: 04/15/2018 15:33   Ct Extrem Lower W Cm Bil  Result Date: 04/15/2018 CLINICAL DATA:  Bilateral thigh and right inguinal subcutaneous masses. EXAM: CT CHEST, ABDOMEN, AND PELVIS WITH CONTRAST CT OF THE LOWER BILATERAL EXTREMITY WITH CONTRAST TECHNIQUE: Multidetector CT imaging of the chest, abdomen, pelvis, and lower bilateral extremity was performed following the standard protocol during bolus administration of intravenous contrast. CONTRAST:  12mL ISOVUE-300 IOPAMIDOL (ISOVUE-300) INJECTION 61% Creatinine was obtained on site at [facility] at [address] Results: Creatinine 1.0 mg/dL. COMPARISON:  None. FINDINGS: CT CHEST FINDINGS Cardiovascular: Normal heart size. No  pericardial effusion. No thoracic aortic aneurysm or dissection. Coronary, aortic arch, and branch vessel atherosclerotic vascular disease. No central pulmonary embolism. Mediastinum/Nodes: No enlarged mediastinal, hilar, or axillary lymph nodes. 2.2 cm hypodense nodule in the thyroid isthmus. The trachea and esophagus demonstrate no significant findings. Lungs/Pleura: Biapical pleuroparenchymal scarring, greater on the right. Scarring in the anterior right upper lobe. No suspicious pulmonary nodule. No focal consolidation, pleural effusion, or pneumothorax. Musculoskeletal: No chest wall mass or suspicious bone lesions identified. CT ABDOMEN PELVIS FINDINGS Hepatobiliary: No focal liver abnormality is seen. No gallstones, gallbladder wall thickening, or biliary dilatation. Pancreas: Unremarkable. No pancreatic ductal dilatation or surrounding inflammatory changes. Spleen: Normal in size without focal abnormality. Adrenals/Urinary Tract: The adrenal glands are unremarkable. Subcentimeter low-density lesion in the right kidney is too small to characterize. No renal or ureteral calculi. No hydronephrosis. The bladder is largely decompressed.  Bladder wall trabeculation with a few small diverticula. Stomach/Bowel: Stomach is within normal limits. Appendix appears normal. No evidence of bowel wall thickening, distention, or inflammatory changes. Extensive left-sided colonic diverticulosis. Vascular/Lymphatic: Aortic atherosclerosis. No enlarged abdominal or pelvic lymph nodes. Reproductive: Prostate central gland hypertrophy indenting the bladder base. Other: No abdominal wall hernia or abnormality. No abdominopelvic ascites. No pneumoperitoneum. Musculoskeletal: No acute or significant osseous findings. CT LOWER BILATERAL EXTREMITY FINDINGS Bones/Joint/Cartilage No acute fracture or dislocation. No osseous destruction or periosteal reaction. Mild bilateral hip and knee osteoarthritis. Muscles and Tendons No muscle  atrophy. Soft tissues Large superficial, ulcerated right inguinal mass measuring 3.2 x 7.4 x 7.2 cm. Additional irregular superficial skin nodularity more laterally along the right groin. These appears separate from the underlying normal appearing inguinal lymph nodes. Similar appearing 4.5 x 1.6 x 4.4 cm superficial nodular mass along the medial mid right thigh. There is no involvement of the underlying deep fascia. Similar appearing 4.2 x 1.0 x 6.4 cm superficial nodular mass along the lateral distal left thigh. The mass contacts the underlying the fascia overlying the distal vastus lateralis muscle. IMPRESSION: 1. Large superficial, ulcerated right inguinal mass measuring up to 7.4 cm with additional irregular satellite superficial skin and subcutaneous nodularity more laterally along the right groin. Similar-appearing superficial nodular masses along the medial mid right thigh and lateral distal left thigh. Differential diagnosis includes melanoma, squamous cell carcinoma, or cutaneous lymphoma. Correlation with tissue sampling is recommended. 2. No lymphadenopathy in the chest, abdomen, or pelvis. 3. 2.2 cm hypodense nodule in the thyroid isthmus. Consider further evaluation with thyroid ultrasound as clinically indicated given the patient's age. This follows ACR consensus guidelines: Managing Incidental Thyroid Nodules Detected on Imaging: White Paper of the ACR Incidental Thyroid Findings Committee. J Am Coll Radiol 2015; 12:143-150. 4. Central prostate gland hypertrophy with evidence of chronic bladder outlet obstruction. 5.  Aortic atherosclerosis (ICD10-I70.0). Electronically Signed   By: Titus Dubin M.D.   On: 04/15/2018 15:33    ASSESSMENT & PLAN:  85 y.o. male with  1.Newly diagnosed Diffuse Large B-Cell Lymphoma PLAN -Discussed patient's most recent labs from 04/26/18, HGB at 11.3, WBC at 8.0k, PLT at 242k -Discussed the 04/28/18 Right inguinal lymph node biopsy results which revealed  Diffuse Large B-Cell Lymphoma -Discussed the 04/15/18 CT C/A/P and Lower Extremities which revealed Large superficial, ulcerated right inguinal mass measuringup to 7.4 cm with additional irregular satellite superficial skin and subcutaneous nodularity more laterally along the right groin. Similar-appearing superficial nodular masses along the medial mid right thigh and lateral distal left thigh. Differential diagnosis includes melanoma, squamous cell carcinoma, or cutaneous lymphoma. Correlation with tissue sampling is recommended. 2. No lymphadenopathy in the chest, abdomen, or pelvis. 3. 2.2 cm hypodense nodule in the thyroid isthmus. Consider further evaluation with thyroid ultrasound as clinically indicated given the patient's age. 4. Central prostate gland hypertrophy with evidence of chronic bladder outlet obstruction. 5.  Aortic atherosclerosis -Discussed the significance and diagnosis of diffuse large b-cell lymphoma -Discussed the patient's goals of care with the pt and his son, who note that they do not prefer aggressive treatment -Discussed that the patient's lymphoma would not be curable without systemic IV therapies, which the pt and his son understand -Discussed conservative and palliative treatment approaches including 4 weekly doses of Rituxan vs RT for local control -The pt and son prefer to speak with Rad Onc for consideration of palliative RT -Ordering topical antibiotic (metronidazole) for right inguinal ulcer -Will see the pt back  in 4 weeks   Urgent referral to radiation oncology for palliative RT to rt groin lymphoma RTC with Dr Irene Limbo with labs in 4 weeks   All of the patients questions were answered with apparent satisfaction. The patient knows to call the clinic with any problems, questions or concerns.  The total time spent in the appt was 60 minutes and more than 50% was on counseling and direct patient cares.    Sullivan Lone MD MS AAHIVMS [facility] Hematology/Oncology  Physician [facility]  (Office):       [phone] (Work cell):  [phone] (Fax):           [phone]  05/11/2018 3:10 PM  I, Baldwin Jamaica, am acting as a scribe for Dr. Sullivan Lone.   .I have reviewed the above documentation for accuracy and completeness, and I agree with the above. Brunetta Genera MD

## 2018-05-11 NOTE — Telephone Encounter (Signed)
New Message:   Called pt on 05/11/18. No answer lft a voicemail to schedule from referral received.

## 2018-05-11 NOTE — Telephone Encounter (Signed)
Gave avs and calendar ° °

## 2018-05-17 ENCOUNTER — Telehealth: Payer: Self-pay

## 2018-05-17 ENCOUNTER — Ambulatory Visit: Payer: Medicare HMO

## 2018-05-17 ENCOUNTER — Ambulatory Visit
Admission: RE | Admit: 2018-05-17 | Discharge: 2018-05-17 | Disposition: A | Discharge status: Home / Self Care | Payer: Medicare HMO | Source: Ambulatory Visit | Attending: Radiation Oncology | Admitting: Radiation Oncology

## 2018-05-17 NOTE — Telephone Encounter (Signed)
I called Johnny May due to him being late for his apt with Dr. Isidore Moos today. His son answered and reported that they would need to reschedule. I have notified the scheduler who will give them a call.

## 2018-06-01 ENCOUNTER — Ambulatory Visit
Admission: RE | Admit: 2018-06-01 | Discharge: 2018-06-01 | Disposition: A | Discharge status: Home / Self Care | Payer: Medicare HMO | Source: Ambulatory Visit | Attending: Radiation Oncology | Admitting: Radiation Oncology

## 2018-06-01 ENCOUNTER — Telehealth: Payer: Self-pay

## 2018-06-01 ENCOUNTER — Ambulatory Visit: Payer: Medicare HMO

## 2018-06-01 NOTE — Telephone Encounter (Signed)
Mr. Johnny May did not present for his appointment with Dr. Isidore Moos today. I called and spoke with his son, Johnny May. He apologized for not calling to cancel. He reports that his father lives in an assisted living and is currently on lock-down due to COVID-19. His son is getting daily reports from his father's nurses regarding the mass to his leg. He is taking his care day-to-day. He does tell me that he also has home visits provided by doctors as needed. He would like to call us to reschedule based on his father's needs and does not want to schedule a new appointment at this time.

## 2018-06-10 ENCOUNTER — Telehealth: Payer: Self-pay | Admitting: Hematology

## 2018-06-10 NOTE — Telephone Encounter (Signed)
Cancelled appt per pt request 4/3 sch message - left message for patient to call back when ready to r/s

## 2018-06-14 ENCOUNTER — Inpatient Hospital Stay: Payer: Medicare HMO | Admitting: Hematology

## 2018-06-14 ENCOUNTER — Inpatient Hospital Stay: Payer: Medicare HMO

## 2020-02-12 ENCOUNTER — Emergency Department (HOSPITAL_COMMUNITY): Payer: Medicare HMO

## 2020-02-12 ENCOUNTER — Encounter (HOSPITAL_COMMUNITY): Payer: Self-pay | Admitting: Student

## 2020-02-12 ENCOUNTER — Other Ambulatory Visit: Payer: Self-pay

## 2020-02-12 ENCOUNTER — Emergency Department (HOSPITAL_COMMUNITY)
Admission: EM | Admit: 2020-02-12 | Discharge: 2020-02-13 | Disposition: A | Discharge status: Home / Self Care | Payer: Medicare HMO | Attending: Emergency Medicine | Admitting: Emergency Medicine

## 2020-02-12 DIAGNOSIS — Z79899 Other long term (current) drug therapy: Secondary | ICD-10-CM | POA: Insufficient documentation

## 2020-02-12 DIAGNOSIS — W19XXXA Unspecified fall, initial encounter: Secondary | ICD-10-CM | POA: Insufficient documentation

## 2020-02-12 DIAGNOSIS — M25562 Pain in left knee: Secondary | ICD-10-CM | POA: Diagnosis not present

## 2020-02-12 DIAGNOSIS — F0391 Unspecified dementia with behavioral disturbance: Secondary | ICD-10-CM | POA: Diagnosis not present

## 2020-02-12 DIAGNOSIS — M25551 Pain in right hip: Secondary | ICD-10-CM | POA: Diagnosis present

## 2020-02-12 DIAGNOSIS — Y92007 Garden or yard of unspecified non-institutional (private) residence as the place of occurrence of the external cause: Secondary | ICD-10-CM | POA: Insufficient documentation

## 2020-02-12 DIAGNOSIS — I10 Essential (primary) hypertension: Secondary | ICD-10-CM | POA: Diagnosis not present

## 2020-02-12 DIAGNOSIS — M25552 Pain in left hip: Secondary | ICD-10-CM | POA: Diagnosis not present

## 2020-02-12 DIAGNOSIS — R109 Unspecified abdominal pain: Secondary | ICD-10-CM | POA: Diagnosis not present

## 2020-02-12 DIAGNOSIS — Z87891 Personal history of nicotine dependence: Secondary | ICD-10-CM | POA: Diagnosis not present

## 2020-02-12 DIAGNOSIS — Z7982 Long term (current) use of aspirin: Secondary | ICD-10-CM | POA: Diagnosis not present

## 2020-02-12 LAB — URINALYSIS, ROUTINE W REFLEX MICROSCOPIC
Bilirubin Urine: NEGATIVE
Glucose, UA: NEGATIVE mg/dL
Hgb urine dipstick: NEGATIVE
Ketones, ur: NEGATIVE mg/dL
Leukocytes,Ua: NEGATIVE
Nitrite: NEGATIVE
Protein, ur: NEGATIVE mg/dL
Specific Gravity, Urine: 1.019 (ref 1.005–1.030)
pH: 5 (ref 5.0–8.0)

## 2020-02-12 LAB — BASIC METABOLIC PANEL
Anion gap: 7 (ref 5–15)
BUN: 24 mg/dL — ABNORMAL HIGH (ref 8–23)
CO2: 25 mmol/L (ref 22–32)
Calcium: 8.2 mg/dL — ABNORMAL LOW (ref 8.9–10.3)
Chloride: 104 mmol/L (ref 98–111)
Creatinine, Ser: 1.11 mg/dL (ref 0.61–1.24)
GFR, Estimated: >60 mL/min (ref >60)
Glucose, Bld: 104 mg/dL — ABNORMAL HIGH (ref 70–99)
Potassium: 3.6 mmol/L (ref 3.5–5.1)
Sodium: 136 mmol/L (ref 135–145)

## 2020-02-12 LAB — CBC WITH DIFFERENTIAL/PLATELET
Abs Immature Granulocytes: 0.02 10*3/uL (ref 0.00–0.07)
Basophils Absolute: 0 10*3/uL (ref 0.0–0.1)
Basophils Relative: 0 %
Eosinophils Absolute: 0 10*3/uL (ref 0.0–0.5)
Eosinophils Relative: 0 %
HCT: 33.6 % — ABNORMAL LOW (ref 39.0–52.0)
Hemoglobin: 10.5 g/dL — ABNORMAL LOW (ref 13.0–17.0)
Immature Granulocytes: 0 %
Lymphocytes Relative: 20 %
Lymphs Abs: 1.7 10*3/uL (ref 0.7–4.0)
MCH: 28.4 pg (ref 26.0–34.0)
MCHC: 31.3 g/dL (ref 30.0–36.0)
MCV: 90.8 fL (ref 80.0–100.0)
Monocytes Absolute: 0.9 10*3/uL (ref 0.1–1.0)
Monocytes Relative: 10 %
Neutro Abs: 5.9 10*3/uL (ref 1.7–7.7)
Neutrophils Relative %: 70 %
Platelets: 221 10*3/uL (ref 150–400)
RBC: 3.7 MIL/uL — ABNORMAL LOW (ref 4.22–5.81)
RDW: 13.9 % (ref 11.5–15.5)
WBC: 8.6 10*3/uL (ref 4.0–10.5)
nRBC: 0 % (ref 0.0–0.2)

## 2020-02-12 MED ORDER — IOHEXOL 300 MG/ML  SOLN
100.0000 mL | Freq: Once | INTRAMUSCULAR | Status: AC | PRN
  Administered 2020-02-12: 100 mL via INTRAVENOUS

## 2020-02-12 NOTE — ED Triage Notes (Signed)
Patient BIB [facility] c/o left hip pain after a fall yesterday from [facility].  Per staff, no LOC or impact to his head.  No blood thinner use. Hx of dementia and HTN.  Vitals were 200/90 60 18 98% on room air

## 2020-02-12 NOTE — ED Notes (Signed)
Patient transported to X-ray 

## 2020-02-12 NOTE — ED Provider Notes (Signed)
[facility] DEPT Provider Note   CSN: [id number] Arrival date & time: 02/12/20  1705     History Chief Complaint  Patient presents with  . Fall  . Hip Pain    Johnny May is a 86 y.o. male.  Level 5 caveat secondary to dementia.  Per EMS patient had a fall yesterday at [facility].  No apparent injury at that time.  No reported loss of consciousness.  Today was complaining of some right hip pain.  Blood pressure is also elevated.  Patient himself is a very poor historian and very hard of hearing.  He seems to be complaining of some low abdominal pain.  He also some pain in his left knee.  The history is provided by the patient and the EMS personnel.  Fall This is a new problem. The current episode started yesterday. The problem occurs rarely. The problem has not changed since onset.Associated symptoms include abdominal pain. Pertinent negatives include no chest pain, no headaches and no shortness of breath. The symptoms are aggravated by walking. Nothing relieves the symptoms. He has tried nothing for the symptoms. The treatment provided no relief.  Hip Pain Associated symptoms include abdominal pain. Pertinent negatives include no chest pain, no headaches and no shortness of breath.       Past Medical History:  Diagnosis Date  . Anxiety   . BPH (benign prostatic hyperplasia)   . Hyperlipidemia   . Hypertension   . Memory loss   . Osteoarthritis   . RLS (restless legs syndrome)   . Senile purpura ([place])   . Vitamin B12 deficiency     Patient Active Problem List   Diagnosis Date Noted  . Moderate dementia with behavioral disturbance ([place]) 06/18/2016    Past Surgical History:  Procedure Laterality Date  . LYMPH NODE BIOPSY Right 04/28/2018   Procedure: EXCISIONAL LYMPH NODE BIOPSY RIGHT GROIN ERAS PATHWAY;  Surgeon: Donnie Mesa, MD;  Location: [place];  Service: General;  Laterality: Right;  . PROSTATE BIOPSY         Family  History  Problem Relation Age of Onset  . Heart disease Mother   . Heart disease Father     Social History   Tobacco Use  . Smoking status: Former Smoker    Packs/day: 1.00    Quit date: 03/09/1966    Years since quitting: 53.9  . Smokeless tobacco: Never Used  . Tobacco comment: 50 years ago quit  Substance Use Topics  . Alcohol use: No  . Drug use: No    Home Medications Prior to Admission medications   Medication Sig Start Date End Date Taking? Authorizing Provider  aspirin EC 81 MG tablet Take 162 mg by mouth daily.     [provider]  citalopram (CELEXA) 10 MG tablet Take 1 tablet (10 mg total) by mouth daily. 07/04/16   Patrecia Pour, NP  donepezil (ARICEPT) 5 MG tablet Take 5 mg by mouth at bedtime.    [provider]  gabapentin (NEURONTIN) 300 MG capsule Take 300 mg by mouth at bedtime.    [provider]  Melatonin 3 MG TABS Take 3 mg by mouth at bedtime.    [provider]  metroNIDAZOLE (METROGEL) 1 % gel Apply topically 2 (two) times daily. To rt groin wound/ulcer 05/11/18   Brunetta Genera, MD  traZODone (DESYREL) 50 MG tablet Take 25 mg by mouth at bedtime. 03/19/18   [provider]  vitamin B-12 (  CYANOCOBALAMIN) 1000 MCG tablet Take 1,000 mcg by mouth daily.    [provider]    Allergies    Penicillins  Review of Systems   Review of Systems  Unable to perform ROS: Dementia  Respiratory: Negative for shortness of breath.   Cardiovascular: Negative for chest pain.  Gastrointestinal: Positive for abdominal pain.  Musculoskeletal: Negative for back pain.  Neurological: Negative for headaches.    Physical Exam Updated Vital Signs BP (!) 186/91   Pulse 61   Resp 12   Ht 5\' 10"  (1.778 m)   Wt 77 kg   SpO2 100%   BMI 24.36 kg/m   Physical Exam Vitals and nursing note reviewed.  Constitutional:      Appearance: Normal appearance. He is well-developed.  HENT:     Head: Normocephalic and  atraumatic.  Eyes:     Conjunctiva/sclera: Conjunctivae normal.  Cardiovascular:     Rate and Rhythm: Normal rate and regular rhythm.     Heart sounds: No murmur heard.   Pulmonary:     Effort: Pulmonary effort is normal. No respiratory distress.     Breath sounds: Normal breath sounds.  Abdominal:     Palpations: Abdomen is soft.     Tenderness: There is no abdominal tenderness.  Musculoskeletal:        General: Tenderness present.     Cervical back: Neck supple.     Right lower leg: No edema.     Left lower leg: No edema.     Comments: Full range of motion of bilateral upper extremities and right lower extremity without any pain or limitations.  Patient has some pain with left hip flexion and also pain in the left knee.  There is some ecchymosis to the left knee.  Ankle nontender.  Distal pulses and sensation intact.  No neck or back pain.  Skin:    General: Skin is warm and dry.     Capillary Refill: Capillary refill takes less than 2 seconds.  Neurological:     General: No focal deficit present.     Mental Status: He is alert. Mental status is at baseline.     ED Results / Procedures / Treatments   Labs (all labs ordered are listed, but only abnormal results are displayed) Labs Reviewed  BASIC METABOLIC PANEL - Abnormal; Notable for the following components:      Result Value   Glucose, Bld 104 (*)    BUN 24 (*)    Calcium 8.2 (*)    All other components within normal limits  CBC WITH DIFFERENTIAL/PLATELET - Abnormal; Notable for the following components:   RBC 3.70 (*)    Hemoglobin 10.5 (*)    HCT 33.6 (*)    All other components within normal limits  URINALYSIS, ROUTINE W REFLEX MICROSCOPIC    EKG None  Radiology CT Abdomen Pelvis W Contrast  Result Date: 02/12/2020 CLINICAL DATA:  Left hip pain after fall yesterday. Diverticulitis suspected. EXAM: CT ABDOMEN AND PELVIS WITH CONTRAST TECHNIQUE: Multidetector CT imaging of the abdomen and pelvis was performed  using the standard protocol following bolus administration of intravenous contrast. CONTRAST:  159mL OMNIPAQUE IOHEXOL 300 MG/ML  SOLN COMPARISON:  CT abdomen pelvis 04/15/2018. FINDINGS: Lower chest: No acute abnormality. Hepatobiliary: No focal liver abnormality. No gallstones, gallbladder wall thickening, or pericholecystic fluid. No biliary dilatation. Pancreas: No focal lesion. Normal pancreatic contour. No surrounding inflammatory changes. No main pancreatic ductal dilatation. Spleen: Normal in size without focal abnormality. A splenule  is noted. Adrenals/Urinary Tract: No adrenal nodule bilaterally. Bilateral kidneys enhance symmetrically. Subcentimeter hypodensities are too small to characterize. No hydronephrosis. No hydroureter. The urinary bladder is decompressed with circumferential urinary bladder wall thickening. Stomach/Bowel: Stomach is within normal limits. No evidence of bowel wall thickening or dilatation. Normal bowel wall enhancement. No pneumatosis. Diffuse sigmoid diverticulosis. Few scattered colonic diverticula more proximally. Appendix appears normal. Vascular/Lymphatic: No abdominal aorta or iliac aneurysm. Severe calcified and noncalcified atherosclerotic plaque of the aorta and its branches. Reproductive: Suggestion of median lobe hypertrophy of the prostate. Other: No intraperitoneal free fluid. No intraperitoneal free gas. No organized fluid collection. Musculoskeletal: Small right inguinal hernia containing a short loop of distal ileum. Dextrocurvature of the thoracolumbar spine. Associated multilevel severe degenerative changes of the spine. No suspicious lytic or blastic osseous lesions. No acute displaced fracture. IMPRESSION: 1. Diffuse sigmoid diverticulosis with no definite findings of acute diverticulitis. 2. Small right inguinal hernia containing a short loop of distal ileum. No findings to suggest ischemia or obstruction. 3. Urinary bladder wall thickening which could be due  to under distension, outlet obstruction, or infection. Correlate with urinalysis. 4. Prostate median lobe hypertrophy. 5.  Aortic Atherosclerosis (ICD10-I70.0). Electronically Signed   By: Iven Finn M.D.   On: 02/12/2020 21:36   DG Knee Complete 4 Views Left  Result Date: 02/12/2020 CLINICAL DATA:  Left knee pain after fall yesterday. EXAM: LEFT KNEE - COMPLETE 4+ VIEW COMPARISON:  None. FINDINGS: No evidence of fracture, dislocation, or joint effusion. No evidence of arthropathy. Moderate patellar spurring is noted. Soft tissues are unremarkable. IMPRESSION: Moderate patellar spurring. No acute abnormality seen in the left knee. Electronically Signed   By: Marijo Conception M.D.   On: 02/12/2020 18:44   DG Hip Unilat With Pelvis 2-3 Views Left  Result Date: 02/12/2020 CLINICAL DATA:  Left hip pain after fall yesterday. EXAM: DG HIP (WITH OR WITHOUT PELVIS) 2-3V LEFT COMPARISON:  None. FINDINGS: There is no evidence of hip fracture or dislocation. Moderate osteophyte formation is seen involving both hip joints. IMPRESSION: Moderate osteoarthritis of both hip joints. No acute abnormality is noted. Electronically Signed   By: Marijo Conception M.D.   On: 02/12/2020 18:43    Procedures Procedures (including critical care time)  Medications Ordered in ED Medications  iohexol (OMNIPAQUE) 300 MG/ML solution 100 mL (100 mLs Intravenous Contrast Given 02/12/20 2050)    ED Course  I have reviewed the triage vital signs and the nursing notes.  Pertinent labs & imaging results that were available during my care of the patient were reviewed by me and considered in my medical decision making (see chart for details).  Clinical Course as of Feb 12 2243  Mon Feb 12, 2020  4259 Patient was able to safely ambulate in the department.   [MB]    Clinical Course User Index [MB] Hayden Rasmussen, MD   MDM Rules/Calculators/A&P                         This patient complains of low abdominal pain, left  knee and hip pain; this involves an extensive number of treatment Options and is a complaint that carries with it a high risk of complications and Morbidity. The differential includes fracture, contusion, diverticulitis, UTI, dislocation  I ordered, reviewed and interpreted labs, which included CBC with normal white count, hemoglobin low not far from baseline, chemistries fairly unremarkable, urinalysis negative  I ordered imaging studies which included left  hip and knee x-ray, CT abdomen and pelvis and I independently    visualized and interpreted imaging which showed degenerative changes, no acute findings Additional history obtained from EMS Previous records obtained and reviewed in epic, no recent admissions  Critical Interventions: None  After the interventions stated above, I reevaluated the patient and found patient to be minimally symptomatic.  I reviewed the findings with him and recommended he follow-up with his primary care doctor.  Patient awaiting ambulance to return him to his facility.   Final Clinical Impression(s) / ED Diagnoses Final diagnoses:  Fall, initial encounter  Left hip pain  Acute pain of left knee    Rx / DC Orders ED Discharge Orders    None       Hayden Rasmussen, MD 02/13/20 [phone]

## 2020-02-12 NOTE — ED Notes (Signed)
ED Provider at bedside. 

## 2020-02-12 NOTE — ED Notes (Signed)
Patient back from x-ray 

## 2020-02-12 NOTE — ED Notes (Signed)
Bed alarm was placed and activated for pt's safety.

## 2020-02-12 NOTE — Discharge Instructions (Addendum)
You were seen in the emergency department for evaluation of left hip and knee pain and lower abdominal pain after a fall yesterday.  You had x-rays that did not show any fracture.  You had blood work urinalysis and a CAT scan of your abdomen and pelvis that also did not show any significant findings.  Please use Tylenol for pain.  Follow-up with your doctor.  Return to the emergency department for any worsening or concerning symptoms

## 2020-02-12 NOTE — ED Notes (Signed)
Patient assisted with bathroom needs and ambulated.  Currently back in bed with bed alarm set.

## 2020-02-12 NOTE — ED Notes (Signed)
[facility] called  

## 2020-02-13 NOTE — ED Notes (Signed)
Pt 2 person assisted to restroom to urinate. Assisted back into bed and given blankets.

## 2020-08-05 ENCOUNTER — Emergency Department (HOSPITAL_COMMUNITY): Payer: Medicare HMO

## 2020-08-05 ENCOUNTER — Other Ambulatory Visit: Payer: Self-pay

## 2020-08-05 ENCOUNTER — Emergency Department (HOSPITAL_COMMUNITY)
Admission: EM | Admit: 2020-08-05 | Discharge: 2020-08-05 | Disposition: A | Discharge status: Home / Self Care | Payer: Medicare HMO | Attending: Emergency Medicine | Admitting: Emergency Medicine

## 2020-08-05 ENCOUNTER — Encounter (HOSPITAL_COMMUNITY): Payer: Self-pay

## 2020-08-05 DIAGNOSIS — I10 Essential (primary) hypertension: Secondary | ICD-10-CM | POA: Diagnosis not present

## 2020-08-05 DIAGNOSIS — M25561 Pain in right knee: Secondary | ICD-10-CM | POA: Diagnosis present

## 2020-08-05 DIAGNOSIS — F0390 Unspecified dementia without behavioral disturbance: Secondary | ICD-10-CM | POA: Diagnosis not present

## 2020-08-05 DIAGNOSIS — Z7982 Long term (current) use of aspirin: Secondary | ICD-10-CM | POA: Insufficient documentation

## 2020-08-05 DIAGNOSIS — Z87891 Personal history of nicotine dependence: Secondary | ICD-10-CM | POA: Insufficient documentation

## 2020-08-05 DIAGNOSIS — M25569 Pain in unspecified knee: Secondary | ICD-10-CM

## 2020-08-05 NOTE — ED Notes (Signed)
Notified Tomasita Crumble, pt's son, of pt's status per pt request

## 2020-08-05 NOTE — Discharge Instructions (Addendum)
You have been seen and discharged from the emergency department.  The x-ray of your knees showed osteoarthritis but no fracture/acute finding.  Follow-up with your primary provider for reevaluation and further care. Take home medications as prescribed. If you have any worsening symptoms or further concerns for your health please return to an emergency department for further evaluation.

## 2020-08-05 NOTE — ED Provider Notes (Signed)
[facility] DEPT Provider Note   CSN: [id number] Arrival date & time: 08/05/20  1632     History Chief Complaint  Patient presents with  . Knee Pain    Johnny May is a 87 y.o. male.  HPI   87 year old male with past medical history of HTN, HLD, dementia presents the emergency department for reported knee pain.  Patient has dementia, he is currently denying knee pain.  However the assisted living staff is stating that he was limping and complaining of right knee pain.  When I push the patient and ask if the knee has been hurting he mentioned the left but seems confused.  No known trauma or fall.  Patient is otherwise pleasantly confused.  Past Medical History:  Diagnosis Date  . Anxiety   . BPH (benign prostatic hyperplasia)   . Hyperlipidemia   . Hypertension   . Memory loss   . Osteoarthritis   . RLS (restless legs syndrome)   . Senile purpura ([place])   . Vitamin B12 deficiency     Patient Active Problem List   Diagnosis Date Noted  . Moderate dementia with behavioral disturbance ([place]) 06/18/2016    Past Surgical History:  Procedure Laterality Date  . LYMPH NODE BIOPSY Right 04/28/2018   Procedure: EXCISIONAL LYMPH NODE BIOPSY RIGHT GROIN ERAS PATHWAY;  Surgeon: Donnie Mesa, MD;  Location: [place];  Service: General;  Laterality: Right;  . PROSTATE BIOPSY         Family History  Problem Relation Age of Onset  . Heart disease Mother   . Heart disease Father     Social History   Tobacco Use  . Smoking status: Former Smoker    Packs/day: 1.00    Quit date: 03/09/1966    Years since quitting: 54.4  . Smokeless tobacco: Never Used  . Tobacco comment: 50 years ago quit  Substance Use Topics  . Alcohol use: No  . Drug use: No    Home Medications Prior to Admission medications   Medication Sig Start Date End Date Taking? Authorizing Provider  aspirin EC 81 MG tablet Take 162 mg by mouth daily.     [provider]   citalopram (CELEXA) 10 MG tablet Take 1 tablet (10 mg total) by mouth daily. 07/04/16   Patrecia Pour, NP  donepezil (ARICEPT) 5 MG tablet Take 5 mg by mouth at bedtime.    [provider]  gabapentin (NEURONTIN) 300 MG capsule Take 300 mg by mouth at bedtime.    [provider]  Melatonin 3 MG TABS Take 3 mg by mouth at bedtime.    [provider]  metroNIDAZOLE (METROGEL) 1 % gel Apply topically 2 (two) times daily. To rt groin wound/ulcer 05/11/18   Brunetta Genera, MD  traZODone (DESYREL) 50 MG tablet Take 25 mg by mouth at bedtime. 03/19/18   [provider]  vitamin B-12 (CYANOCOBALAMIN) 1000 MCG tablet Take 1,000 mcg by mouth daily.    [provider]    Allergies    Penicillins  Review of Systems   Review of Systems  Unable to perform ROS: Dementia    Physical Exam Updated Vital Signs BP (!) 153/59 (BP Location: Left Arm)   Pulse 64   Temp 98.6 F (37 C) (Oral)   Resp 15   SpO2 95%   Physical Exam Vitals and nursing note reviewed.  Constitutional:      Appearance: Normal appearance.  HENT:  Head: Normocephalic.     Mouth/Throat:     Mouth: Mucous membranes are moist.  Cardiovascular:     Rate and Rhythm: Normal rate.  Pulmonary:     Effort: Pulmonary effort is normal. No respiratory distress.  Abdominal:     Palpations: Abdomen is soft.     Tenderness: There is no abdominal tenderness.  Musculoskeletal:     Comments: No swelling or deformity to either knee, no focal tenderness to palpation of either knee, lower extremities appear neurovascularly intact, no calf swelling  Skin:    General: Skin is warm.  Neurological:     Mental Status: He is alert and oriented to person, place, and time. Mental status is at baseline.  Psychiatric:        Mood and Affect: Mood normal.     ED Results / Procedures / Treatments   Labs (all labs ordered are listed, but only abnormal results are displayed) Labs Reviewed - No  data to display  EKG None  Radiology No results found.  Procedures Procedures   Medications Ordered in ED Medications - No data to display  ED Course  I have reviewed the triage vital signs and the nursing notes.  Pertinent labs & imaging results that were available during my care of the patient were reviewed by me and considered in my medical decision making (see chart for details).    MDM Rules/Calculators/A&P                          87 year old male presents emergency department with reported knee pain.  Patient has dementia, is unable to specify left or right knee pain.  On exam the knees appear unremarkable, are nontender to palpation, nontender with range of motion.  No redness or swelling.  He is afebrile.  X-ray of the right knee shows osteoarthritis with a small joint effusion, x-ray of the left knee shows osteoarthritis.  No fracture or other acute finding.  The knees are stable on exam.  He has been standing and ambulatory without any difficulty.  Patient will be discharged and treated as an outpatient.  Discharge plan and strict return to ED precautions discussed, patient verbalizes understanding and agreement.  Final Clinical Impression(s) / ED Diagnoses Final diagnoses:  None    Rx / DC Orders ED Discharge Orders    None       Lorelle Gibbs, DO 08/05/20 1942

## 2020-08-05 NOTE — ED Triage Notes (Signed)
EMS reports from [facility] senior assisted living, staff called out due to Pt limping. Pt c/o pain to right knee when walking. No noted fall per staff or Pt, no known injury.  BP 156/64 HR 62 RR 16 Sp02 96 RA

## 2021-03-08 ENCOUNTER — Emergency Department (HOSPITAL_COMMUNITY): Payer: Medicare HMO

## 2021-03-08 ENCOUNTER — Inpatient Hospital Stay (HOSPITAL_COMMUNITY)
Admission: EM | Admit: 2021-03-08 | Discharge: 2021-03-10 | DRG: 308 | Disposition: A | Discharge status: Hospice | Payer: Medicare HMO | Source: Skilled Nursing Facility | Attending: Internal Medicine | Admitting: Internal Medicine

## 2021-03-08 ENCOUNTER — Other Ambulatory Visit: Payer: Self-pay

## 2021-03-08 ENCOUNTER — Inpatient Hospital Stay (HOSPITAL_COMMUNITY): Payer: Medicare HMO

## 2021-03-08 ENCOUNTER — Encounter (HOSPITAL_COMMUNITY): Payer: Self-pay

## 2021-03-08 DIAGNOSIS — I10 Essential (primary) hypertension: Secondary | ICD-10-CM | POA: Diagnosis present

## 2021-03-08 DIAGNOSIS — R531 Weakness: Secondary | ICD-10-CM

## 2021-03-08 DIAGNOSIS — Z515 Encounter for palliative care: Secondary | ICD-10-CM

## 2021-03-08 DIAGNOSIS — G9340 Encephalopathy, unspecified: Secondary | ICD-10-CM | POA: Diagnosis present

## 2021-03-08 DIAGNOSIS — G2581 Restless legs syndrome: Secondary | ICD-10-CM | POA: Diagnosis present

## 2021-03-08 DIAGNOSIS — C8590 Non-Hodgkin lymphoma, unspecified, unspecified site: Secondary | ICD-10-CM | POA: Diagnosis present

## 2021-03-08 DIAGNOSIS — E785 Hyperlipidemia, unspecified: Secondary | ICD-10-CM | POA: Diagnosis present

## 2021-03-08 DIAGNOSIS — F03B18 Unspecified dementia, moderate, with other behavioral disturbance: Secondary | ICD-10-CM | POA: Diagnosis present

## 2021-03-08 DIAGNOSIS — N179 Acute kidney failure, unspecified: Secondary | ICD-10-CM | POA: Diagnosis present

## 2021-03-08 DIAGNOSIS — Z8572 Personal history of non-Hodgkin lymphomas: Secondary | ICD-10-CM

## 2021-03-08 DIAGNOSIS — Z7189 Other specified counseling: Secondary | ICD-10-CM | POA: Diagnosis not present

## 2021-03-08 DIAGNOSIS — E860 Dehydration: Secondary | ICD-10-CM | POA: Diagnosis present

## 2021-03-08 DIAGNOSIS — R451 Restlessness and agitation: Secondary | ICD-10-CM | POA: Diagnosis not present

## 2021-03-08 DIAGNOSIS — Z66 Do not resuscitate: Secondary | ICD-10-CM | POA: Diagnosis present

## 2021-03-08 DIAGNOSIS — Z789 Other specified health status: Secondary | ICD-10-CM | POA: Diagnosis not present

## 2021-03-08 DIAGNOSIS — Z781 Physical restraint status: Secondary | ICD-10-CM | POA: Diagnosis not present

## 2021-03-08 DIAGNOSIS — F419 Anxiety disorder, unspecified: Secondary | ICD-10-CM | POA: Diagnosis present

## 2021-03-08 DIAGNOSIS — Z79899 Other long term (current) drug therapy: Secondary | ICD-10-CM

## 2021-03-08 DIAGNOSIS — Z7982 Long term (current) use of aspirin: Secondary | ICD-10-CM

## 2021-03-08 DIAGNOSIS — I442 Atrioventricular block, complete: Principal | ICD-10-CM | POA: Diagnosis present

## 2021-03-08 DIAGNOSIS — J690 Pneumonitis due to inhalation of food and vomit: Secondary | ICD-10-CM | POA: Diagnosis present

## 2021-03-08 DIAGNOSIS — R627 Adult failure to thrive: Secondary | ICD-10-CM | POA: Diagnosis present

## 2021-03-08 DIAGNOSIS — G9341 Metabolic encephalopathy: Secondary | ICD-10-CM | POA: Diagnosis present

## 2021-03-08 DIAGNOSIS — F03B4 Unspecified dementia, moderate, with anxiety: Secondary | ICD-10-CM | POA: Diagnosis present

## 2021-03-08 DIAGNOSIS — J159 Unspecified bacterial pneumonia: Secondary | ICD-10-CM | POA: Diagnosis present

## 2021-03-08 DIAGNOSIS — F32A Depression, unspecified: Secondary | ICD-10-CM | POA: Diagnosis present

## 2021-03-08 DIAGNOSIS — N400 Enlarged prostate without lower urinary tract symptoms: Secondary | ICD-10-CM | POA: Diagnosis present

## 2021-03-08 DIAGNOSIS — Z6824 Body mass index (BMI) 24.0-24.9, adult: Secondary | ICD-10-CM | POA: Diagnosis not present

## 2021-03-08 DIAGNOSIS — W19XXXA Unspecified fall, initial encounter: Secondary | ICD-10-CM | POA: Diagnosis present

## 2021-03-08 DIAGNOSIS — I472 Ventricular tachycardia, unspecified: Secondary | ICD-10-CM | POA: Diagnosis present

## 2021-03-08 DIAGNOSIS — Z8249 Family history of ischemic heart disease and other diseases of the circulatory system: Secondary | ICD-10-CM

## 2021-03-08 DIAGNOSIS — Z20822 Contact with and (suspected) exposure to covid-19: Secondary | ICD-10-CM | POA: Diagnosis present

## 2021-03-08 DIAGNOSIS — Z79890 Hormone replacement therapy (postmenopausal): Secondary | ICD-10-CM

## 2021-03-08 LAB — MAGNESIUM: Magnesium: 2.4 mg/dL (ref 1.7–2.4)

## 2021-03-08 LAB — I-STAT CHEM 8, ED
BUN: 38 mg/dL — ABNORMAL HIGH (ref 8–23)
Calcium, Ion: 1.14 mmol/L — ABNORMAL LOW (ref 1.15–1.40)
Chloride: 109 mmol/L (ref 98–111)
Creatinine, Ser: 1.4 mg/dL — ABNORMAL HIGH (ref 0.61–1.24)
Glucose, Bld: 135 mg/dL — ABNORMAL HIGH (ref 70–99)
HCT: 33 % — ABNORMAL LOW (ref 39.0–52.0)
Hemoglobin: 11.2 g/dL — ABNORMAL LOW (ref 13.0–17.0)
Potassium: 4.5 mmol/L (ref 3.5–5.1)
Sodium: 143 mmol/L (ref 135–145)
TCO2: 21 mmol/L — ABNORMAL LOW (ref 22–32)

## 2021-03-08 LAB — COMPREHENSIVE METABOLIC PANEL
ALT: 43 U/L (ref 0–44)
AST: 59 U/L — ABNORMAL HIGH (ref 15–41)
Albumin: 3.2 g/dL — ABNORMAL LOW (ref 3.5–5.0)
Alkaline Phosphatase: 67 U/L (ref 38–126)
Anion gap: 13 (ref 5–15)
BUN: 37 mg/dL — ABNORMAL HIGH (ref 8–23)
CO2: 21 mmol/L — ABNORMAL LOW (ref 22–32)
Calcium: 9 mg/dL (ref 8.9–10.3)
Chloride: 108 mmol/L (ref 98–111)
Creatinine, Ser: 1.68 mg/dL — ABNORMAL HIGH (ref 0.61–1.24)
GFR, Estimated: 37 mL/min — ABNORMAL LOW (ref >60)
Glucose, Bld: 143 mg/dL — ABNORMAL HIGH (ref 70–99)
Potassium: 4.6 mmol/L (ref 3.5–5.1)
Sodium: 142 mmol/L (ref 135–145)
Total Bilirubin: 0.8 mg/dL (ref 0.3–1.2)
Total Protein: 6.9 g/dL (ref 6.5–8.1)

## 2021-03-08 LAB — RESP PANEL BY RT-PCR (FLU A&B, COVID) ARPGX2
Influenza A by PCR: NEGATIVE
Influenza B by PCR: NEGATIVE
SARS Coronavirus 2 by RT PCR: NEGATIVE

## 2021-03-08 LAB — CBC
HCT: 34.3 % — ABNORMAL LOW (ref 39.0–52.0)
Hemoglobin: 10.7 g/dL — ABNORMAL LOW (ref 13.0–17.0)
MCH: 28.1 pg (ref 26.0–34.0)
MCHC: 31.2 g/dL (ref 30.0–36.0)
MCV: 90 fL (ref 80.0–100.0)
Platelets: 217 10*3/uL (ref 150–400)
RBC: 3.81 MIL/uL — ABNORMAL LOW (ref 4.22–5.81)
RDW: 15.1 % (ref 11.5–15.5)
WBC: 16.7 10*3/uL — ABNORMAL HIGH (ref 4.0–10.5)
nRBC: 0 % (ref 0.0–0.2)

## 2021-03-08 LAB — PHOSPHORUS: Phosphorus: 3.6 mg/dL (ref 2.5–4.6)

## 2021-03-08 MED ORDER — HALOPERIDOL LACTATE 5 MG/ML IJ SOLN
2.0000 mg | Freq: Four times a day (QID) | INTRAMUSCULAR | Status: DC | PRN
  Administered 2021-03-08 – 2021-03-09 (×2): 2 mg via INTRAMUSCULAR
  Filled 2021-03-08 (×2): qty 1

## 2021-03-08 MED ORDER — SODIUM CHLORIDE 0.9 % IV SOLN
500.0000 mg | INTRAVENOUS | Status: DC
  Administered 2021-03-08: 500 mg via INTRAVENOUS
  Filled 2021-03-08: qty 5

## 2021-03-08 MED ORDER — HYDRALAZINE HCL 25 MG PO TABS
25.0000 mg | ORAL_TABLET | Freq: Four times a day (QID) | ORAL | Status: DC | PRN
  Administered 2021-03-08: 25 mg via ORAL
  Filled 2021-03-08: qty 1

## 2021-03-08 MED ORDER — QUETIAPINE 12.5 MG HALF TABLET
12.5000 mg | ORAL_TABLET | Freq: Every day | ORAL | Status: DC
  Administered 2021-03-09: 12.5 mg via ORAL
  Filled 2021-03-08 (×2): qty 1

## 2021-03-08 MED ORDER — MELATONIN 3 MG PO TABS: 3.0000 mg | ORAL_TABLET | Freq: Every day | ORAL | Status: DC

## 2021-03-08 MED ORDER — VITAMIN B-12 1000 MCG PO TABS
1000.0000 ug | ORAL_TABLET | Freq: Every day | ORAL | Status: DC
  Filled 2021-03-08: qty 1

## 2021-03-08 MED ORDER — ASPIRIN EC 81 MG PO TBEC
162.0000 mg | DELAYED_RELEASE_TABLET | Freq: Every day | ORAL | Status: DC
  Filled 2021-03-08: qty 2

## 2021-03-08 MED ORDER — GABAPENTIN 300 MG PO CAPS: 300.0000 mg | ORAL_CAPSULE | Freq: Every day | ORAL | Status: DC

## 2021-03-08 MED ORDER — TRAZODONE HCL 50 MG PO TABS
25.0000 mg | ORAL_TABLET | Freq: Every day | ORAL | Status: DC
  Administered 2021-03-08: 25 mg via ORAL
  Filled 2021-03-08: qty 1

## 2021-03-08 MED ORDER — GUAIFENESIN ER 600 MG PO TB12
1200.0000 mg | ORAL_TABLET | Freq: Two times a day (BID) | ORAL | Status: DC
  Administered 2021-03-08 (×2): 1200 mg via ORAL
  Filled 2021-03-08 (×3): qty 2

## 2021-03-08 MED ORDER — CITALOPRAM HYDROBROMIDE 10 MG PO TABS: 10.0000 mg | ORAL_TABLET | Freq: Every day | ORAL | Status: DC

## 2021-03-08 MED ORDER — ENOXAPARIN SODIUM 30 MG/0.3ML IJ SOSY
30.0000 mg | PREFILLED_SYRINGE | INTRAMUSCULAR | Status: DC
  Administered 2021-03-08: 30 mg via SUBCUTANEOUS
  Filled 2021-03-08: qty 0.3

## 2021-03-08 MED ORDER — IPRATROPIUM-ALBUTEROL 0.5-2.5 (3) MG/3ML IN SOLN
3.0000 mL | Freq: Four times a day (QID) | RESPIRATORY_TRACT | Status: DC
  Administered 2021-03-08 – 2021-03-09 (×5): 3 mL via RESPIRATORY_TRACT
  Filled 2021-03-08 (×6): qty 3

## 2021-03-08 MED ORDER — SODIUM CHLORIDE 0.9 % IV SOLN
2.0000 g | INTRAVENOUS | Status: DC
  Administered 2021-03-08: 2 g via INTRAVENOUS
  Filled 2021-03-08: qty 20

## 2021-03-08 MED ORDER — CITALOPRAM HYDROBROMIDE 10 MG PO TABS: 10.0000 mg | ORAL_TABLET | ORAL | Status: DC

## 2021-03-08 MED ORDER — SODIUM CHLORIDE 0.9 % IV SOLN: INTRAVENOUS | Status: DC

## 2021-03-08 MED ORDER — DONEPEZIL HCL 5 MG PO TABS
5.0000 mg | ORAL_TABLET | Freq: Every day | ORAL | Status: DC
  Administered 2021-03-08: 5 mg via ORAL
  Filled 2021-03-08 (×2): qty 1

## 2021-03-08 NOTE — ED Triage Notes (Signed)
Unwitnessed fall, last seen at 10pm last night.  From [facility].  Initially was in sinus brady and then had run of vtach for 5 secs and now back brady.  Small to contusion on left elbow and in c collar.

## 2021-03-08 NOTE — Evaluation (Signed)
Clinical/Bedside Swallow Evaluation Patient Details  Name: ABBY STINES MRN: [id number] Date of Birth: [birth year]  Today's Date: 03/08/2021 Time: SLP Start Time (ACUTE ONLY): 1645 SLP Stop Time (ACUTE ONLY): 1705 SLP Time Calculation (min) (ACUTE ONLY): 20 min  Past Medical History:  Past Medical History:  Diagnosis Date   Anxiety    BPH (benign prostatic hyperplasia)    Hyperlipidemia    Hypertension    Memory loss    Osteoarthritis    RLS (restless legs syndrome)    Senile purpura ([place])    Vitamin B12 deficiency    Past Surgical History:  Past Surgical History:  Procedure Laterality Date   LYMPH NODE BIOPSY Right 04/28/2018   Procedure: EXCISIONAL LYMPH NODE BIOPSY RIGHT GROIN ERAS PATHWAY;  Surgeon: Donnie Mesa, MD;  Location: [facility];  Service: General;  Laterality: Right;   PROSTATE BIOPSY     HPI:  Patient is a 87 y.o. male with PMH: non-Hodgkin's lymphoma in remission, advanced dementia, anxiety/depression, presented with unwitnessed fall at his ALF. In ED patient was bradycardic with HR in the 30's, he was afebrile and CXR showing right lower lobe PNA.    Assessment / Plan / Recommendation  Clinical Impression  Patient presents with clinical s/s of dysphagia as per this bedside swallow evaluation. SLP suspects that current cognitive state is impacting patient's swallow function as he exhibits decreased attention, decreased awareness of boluses and is very restless. His voice was hoarse, oral mucosa was dry and although no secretions observed in mouth, he did cough and expectorate yellow-greenish phlegm at one point during evaluation. No overt s/s aspiration or penetration observed with straw sips of thin liquids (water). He did accept 4 spoon bites of puree solids (applesauce) with mildly delayed oral transit but no overt s/s aspiration or penetration and full clearance of bolus from oral cavity post swallows. He then politely refused any further PO's, perseverating on "I  hope I can get some rest tonight". SLP is recommending to continue with current PO diet of soft solids, thin liquids. SLP to follow up to ensure patient is tolerating PO's. His son who was present in room informed SLP that patient had been "thriving" over at ALF this past 4 years but he has been exhibiting a fairly quick decline and he anticipates patient will require palliative, perhaps hospice care. SLP Visit Diagnosis: Dysphagia, unspecified (R13.10)    Aspiration Risk  Mild aspiration risk;Moderate aspiration risk    Diet Recommendation Dysphagia 3 (Mech soft);Thin liquid   Liquid Administration via: Cup;Straw Medication Administration: Whole meds with puree Supervision: Full supervision/cueing for compensatory strategies;Staff to assist with self feeding Compensations: Slow rate;Small sips/bites;Minimize environmental distractions Postural Changes: Seated upright at 90 degrees    Other  Recommendations Oral Care Recommendations: Oral care BID;Staff/trained caregiver to provide oral care    Recommendations for follow up therapy are one component of a multi-disciplinary discharge planning process, led by the attending physician.  Recommendations may be updated based on patient status, additional functional criteria and insurance authorization.  Follow up Recommendations Other (comment) (TBD pending GOC)      Assistance Recommended at Discharge Frequent or constant Supervision/Assistance  Functional Status Assessment Patient has had a recent decline in their functional status and demonstrates the ability to make significant improvements in function in a reasonable and predictable amount of time.  Frequency and Duration min 1 x/week  1 week       Prognosis Prognosis for Safe Diet Advancement: Fair Barriers to Reach Goals: Cognitive  deficits      Swallow Study   General Date of Onset: 03/08/21 HPI: Patient is a 87 y.o. male with PMH: non-Hodgkin's lymphoma in remission, advanced  dementia, anxiety/depression, presented with unwitnessed fall at his ALF. In ED patient was bradycardic with HR in the 30's, he was afebrile and CXR showing right lower lobe PNA. Type of Study: Bedside Swallow Evaluation Previous Swallow Assessment: none found Diet Prior to this Study: Dysphagia 3 (soft);Thin liquids Temperature Spikes Noted: No Respiratory Status: Room air History of Recent Intubation: No Behavior/Cognition: Alert;Pleasant mood;Confused Oral Cavity Assessment: Dry Oral Care Completed by SLP: Yes Oral Cavity - Dentition: Poor condition;Missing dentition Self-Feeding Abilities: Total assist Patient Positioning: Upright in bed Baseline Vocal Quality: Hoarse Volitional Cough: Cognitively unable to elicit Volitional Swallow: Unable to elicit    Oral/Motor/Sensory Function Overall Oral Motor/Sensory Function: Other (comment) (patient not able to participate in full oral motor exam but no significant weakness or assymetry present)   Ice Chips     Thin Liquid Thin Liquid: Impaired Presentation: Straw Oral Phase Impairments: Poor awareness of bolus Other Comments: no overt s/s aspiration or penetration observed. Initially poor awareness as he was attempting to sip cup when straw presented    Nectar Thick     Honey Thick     Puree Puree: Impaired Oral Phase Impairments: Poor awareness of bolus Oral Phase Functional Implications: Prolonged oral transit   Solid     Solid: Not tested      Sonia Baller, MA, CCC-SLP Speech Therapy

## 2021-03-08 NOTE — Progress Notes (Signed)
Patient very agitated.   Lung exam, patient started to have wheezing which is new as compared to earlier this afternoon. Will D/C IVF. On Duoneb Q6H

## 2021-03-08 NOTE — ED Notes (Signed)
Pt repositioned in bed after sliding down

## 2021-03-08 NOTE — H&P (Addendum)
History and Physical    ESDRAS DELAIR MVH:[id number] DOB: [birth year] DOA: 03/08/2021  PCP: Reymundo Poll, MD (Confirm with patient/family/NH records and if not entered, this has to be entered at [facility] point of entry) Patient coming from: Assisted living  I have personally briefly reviewed patient's old medical records in [place]  Chief Complaint: Feeling ok  HPI: Johnny May is a 87 y.o. male with medical history significant of non-Hodgkin's lymphoma in remission, advanced dementia, anxiety/depression, presented with unwitnessed fall.  Patient is currently confused, unable to provide any history, all history provided by patient son at bedside.  Patient lives by himself at assisted living, family reported that patient overall condition significant decline in the last 2 months.  With increasing confusion and can hardly taking care of himself of daily activities including bathing and dressing.  This morning, patient was found on the floor by himself.  Obviously patient fell at some point last night on his left side.  EMS arrived and found patient in bradycardia with a 5-second V. tach.  ED Course: Remained bradycardia heart rate in the 30s.  Afebrile, blood pressure started to 120s, chest x-ray showed right lower lobe pneumonia.  Family arrived and discussed with ED physician, desires no aggressive measures including pacemaker.  Review of Systems: Unable to perform, patient confused  Past Medical History:  Diagnosis Date   Anxiety    BPH (benign prostatic hyperplasia)    Hyperlipidemia    Hypertension    Memory loss    Osteoarthritis    RLS (restless legs syndrome)    Senile purpura ([place])    Vitamin B12 deficiency     Past Surgical History:  Procedure Laterality Date   LYMPH NODE BIOPSY Right 04/28/2018   Procedure: EXCISIONAL LYMPH NODE BIOPSY RIGHT GROIN ERAS PATHWAY;  Surgeon: Donnie Mesa, MD;  Location: [place];  Service: General;  Laterality: Right;   PROSTATE  BIOPSY       reports that he quit smoking about 55 years ago. He smoked an average of 1 pack per day. He has never used smokeless tobacco. He reports that he does not drink alcohol and does not use drugs.  Allergies  Allergen Reactions   Penicillins Rash    Per pt. When in the [employer] had a little breaking out,but not bad    Family History  Problem Relation Age of Onset   Heart disease Mother    Heart disease Father      Prior to Admission medications   Medication Sig Start Date End Date Taking? Authorizing Provider  aspirin EC 81 MG tablet Take 162 mg by mouth daily.     [provider]  citalopram (CELEXA) 10 MG tablet Take 1 tablet (10 mg total) by mouth daily. 07/04/16   Patrecia Pour, NP  donepezil (ARICEPT) 5 MG tablet Take 5 mg by mouth at bedtime.    [provider]  gabapentin (NEURONTIN) 300 MG capsule Take 300 mg by mouth at bedtime.    [provider]  Melatonin 3 MG TABS Take 3 mg by mouth at bedtime.    [provider]  metroNIDAZOLE (METROGEL) 1 % gel Apply topically 2 (two) times daily. To rt groin wound/ulcer 05/11/18   Brunetta Genera, MD  traZODone (DESYREL) 50 MG tablet Take 25 mg by mouth at bedtime. 03/19/18   [provider]  vitamin B-12 (CYANOCOBALAMIN) 1000 MCG tablet Take 1,000 mcg by mouth daily.    [provider]  Physical Exam: Vitals:   03/08/21 1235 03/08/21 1341 03/08/21 1345 03/08/21 1450  BP: (!) 220/60   (!) 230/60  Pulse: (!) 30 (!) 31 (!) 31 (!) 32  Resp: 18 (!) 21 20 16   Temp:      TempSrc:      SpO2: 96% 100% 99% 97%    Constitutional: NAD, calm, comfortable Vitals:   03/08/21 1235 03/08/21 1341 03/08/21 1345 03/08/21 1450  BP: (!) 220/60   (!) 230/60  Pulse: (!) 30 (!) 31 (!) 31 (!) 32  Resp: 18 (!) 21 20 16   Temp:      TempSrc:      SpO2: 96% 100% 99% 97%   Eyes: PERRL, lids and conjunctivae normal ENMT: Mucous membranes are dry. Posterior pharynx clear of any exudate  or lesions.Normal dentition.  Neck: normal, supple, no masses, no thyromegaly Respiratory: clear to auscultation bilaterally, no wheezing, crackles on right lower field. Normal respiratory effort. No accessory muscle use.  Cardiovascular: Regular rate and rhythm, no murmurs / rubs / gallops. No extremity edema. 2+ pedal pulses. No carotid bruits.  Abdomen: no tenderness, no masses palpated. No hepatosplenomegaly. Bowel sounds positive.  Musculoskeletal: no clubbing / cyanosis. No joint deformity upper and lower extremities. Good ROM, no contractures. Normal muscle tone.  Skin: no rashes, lesions, ulcers. No induration Neurologic: No facial droops, moving all limbs Psychiatric: Awake, oriented to himself, confused about time and place    Labs on Admission: I have personally reviewed following labs and imaging studies  CBC: Recent Labs  Lab 03/08/21 1120 03/08/21 1131  WBC  --  16.7*  HGB 11.2* 10.7*  HCT 33.0* 34.3*  MCV  --  90.0  PLT  --  389   Basic Metabolic Panel: Recent Labs  Lab 03/08/21 1120 03/08/21 1131  NA 143 142  K 4.5 4.6  CL 109 108  CO2  --  21*  GLUCOSE 135* 143*  BUN 38* 37*  CREATININE 1.40* 1.68*  CALCIUM  --  9.0   GFR: CrCl cannot be calculated (Unknown ideal weight.). Liver Function Tests: Recent Labs  Lab 03/08/21 1131  AST 59*  ALT 43  ALKPHOS 67  BILITOT 0.8  PROT 6.9  ALBUMIN 3.2*   No results for input(s): LIPASE, AMYLASE in the last 168 hours. No results for input(s): AMMONIA in the last 168 hours. Coagulation Profile: No results for input(s): INR, PROTIME in the last 168 hours. Cardiac Enzymes: No results for input(s): CKTOTAL, CKMB, CKMBINDEX, TROPONINI in the last 168 hours. BNP (last 3 results) No results for input(s): PROBNP in the last 8760 hours. HbA1C: No results for input(s): HGBA1C in the last 72 hours. CBG: No results for input(s): GLUCAP in the last 168 hours. Lipid Profile: No results for input(s): CHOL, HDL,  LDLCALC, TRIG, CHOLHDL, LDLDIRECT in the last 72 hours. Thyroid Function Tests: No results for input(s): TSH, T4TOTAL, FREET4, T3FREE, THYROIDAB in the last 72 hours. Anemia Panel: No results for input(s): VITAMINB12, FOLATE, FERRITIN, TIBC, IRON, RETICCTPCT in the last 72 hours. Urine analysis:    Component Value Date/Time   COLORURINE YELLOW 02/12/2020 1740   APPEARANCEUR CLEAR 02/12/2020 1740   LABSPEC 1.019 02/12/2020 1740   PHURINE 5.0 02/12/2020 1740   GLUCOSEU NEGATIVE 02/12/2020 1740   HGBUR NEGATIVE 02/12/2020 1740   BILIRUBINUR NEGATIVE 02/12/2020 1740   KETONESUR NEGATIVE 02/12/2020 1740   PROTEINUR NEGATIVE 02/12/2020 1740   NITRITE NEGATIVE 02/12/2020 1740   LEUKOCYTESUR NEGATIVE 02/12/2020 1740    Radiological Exams  on Admission: DG Chest Port 1 View  Result Date: 03/08/2021 CLINICAL DATA:  87 year old male with history of shortness of breath. Unwitnessed fall. EXAM: PORTABLE CHEST 1 VIEW COMPARISON:  Chest x-ray 07/02/2016. FINDINGS: Airspace consolidation in the right lower lobe concerning for pneumonia. Small right pleural effusion. Left lung is clear. No left pleural effusion. No pneumothorax. No evidence of pulmonary edema. Heart size is normal. Upper mediastinal contours are unremarkable. Atherosclerotic calcifications in the thoracic aorta. IMPRESSION: 1. Right lower lobe pneumonia with small right parapneumonic pleural effusion. 2. Aortic atherosclerosis. Electronically Signed   By: Vinnie Langton M.D.   On: 03/08/2021 12:42    EKG: Independently reviewed.  Third-degree AV block  Assessment/Plan Principal Problem:   Heart block AV third degree (HCC) Active Problems:   Third degree AV block ([place])  (please populate well all problems here in Problem List. (For example, if patient is on BP meds at home and you resume or decide to hold them, it is a problem that needs to be her. Same for CAD, COPD, HLD and so on)  Third-degree AV block -Discussed with son at  bedside, patient CODE STATUS DNR, and son discussed with the patient recently regarding his overall declining, and confirmed that patient does not want any aggressive or artificial measures to prolong life.  Son agreed with no pacemaker. -Consult palliative care for possible hospice. Prognosis grave, life expectancy few weeks to few months  Nonsustained V. Tach -Noticed by EMS in the field, K= 4.5, check magnesium and phosphorus level.  Right lower lobe bacterial pneumonia -According to son, patient does not have history of aspiration -Empirical treatments of ceftriaxone and azithromycin -Speech evaluation -Soft diet for now.  Delirium -Trial of Halodol PRN.  Dehydration -We will give some fluids and then recheck BMP.  Fall -Head and neck CT -CK level -No PT right now, will check his BP and orthostatic vital signs tomorrow then decide. -Fall precautions.  Dementia, depression anxiety -Continue home medications.  Non-Hodgkin's lymphoma -No active treatment.  DVT prophylaxis: Heparin subcu Code Status: DNR Family Communication: Son at bedside Disposition Plan: Plan to admit to [facility] for hospice Consults called: Palliative care Admission status: Medsurg admission   Lequita Halt MD [facility] Pager [phone]  03/08/2021, 3:04 PM

## 2021-03-08 NOTE — ED Notes (Signed)
Pt is found pulling out IV and attempting to get out of bed. Attempt to redirect pt. Pt a/ox1, obeys commands for short period and then goes back to doing what he wants to. Per MD, on comfort care only and DC monitoring.

## 2021-03-08 NOTE — ED Notes (Signed)
Pt soiled himself with BM, hands dirty with feces. Pt cleaned, sheets diaper and chux reapplied. Soft restraints reapplied

## 2021-03-08 NOTE — ED Notes (Signed)
Pt still attempting to pull at IV and pulse ox. Attempt to redirect pt

## 2021-03-08 NOTE — ED Notes (Signed)
Patient will not keep monitoring devices on at this time.

## 2021-03-08 NOTE — ED Notes (Signed)
Pt continues to attempt to pull at lines, remove sheets

## 2021-03-08 NOTE — ED Provider Notes (Addendum)
[facility] EMERGENCY DEPARTMENT Provider Note   CSN: [id number] Arrival date & time: 03/08/21  1051     History Chief Complaint  Patient presents with   Johnny May is a 87 y.o. male.  Pt from SNF via EMS s/p fall last night. Pt w hx dementia, limited historian - level 5 caveat. Pt unsure what caused fall. Denies loc. No headache. No chest pain. No sob. No abd pain. Denies neck or back pain. Denies extremity pain. EMS notes HR slow, in 30s.   The history is provided by the patient, medical records, the EMS personnel and a relative. The history is limited by the condition of the patient.  Fall Pertinent negatives include no chest pain, no abdominal pain, no headaches and no shortness of breath.      Past Medical History:  Diagnosis Date   Anxiety    BPH (benign prostatic hyperplasia)    Hyperlipidemia    Hypertension    Memory loss    Osteoarthritis    RLS (restless legs syndrome)    Senile purpura (HCC)    Vitamin B12 deficiency     Patient Active Problem List   Diagnosis Date Noted   Moderate dementia with behavioral disturbance 06/18/2016    Past Surgical History:  Procedure Laterality Date   LYMPH NODE BIOPSY Right 04/28/2018   Procedure: EXCISIONAL LYMPH NODE BIOPSY RIGHT [place];  Surgeon: Donnie Mesa, MD;  Location: [facility];  Service: General;  Laterality: Right;   PROSTATE BIOPSY         Family History  Problem Relation Age of Onset   Heart disease Mother    Heart disease Father     Social History   Tobacco Use   Smoking status: Former    Packs/day: 1.00    Types: Cigarettes    Quit date: 03/09/1966    Years since quitting: 55.0   Smokeless tobacco: Never   Tobacco comments:    50 years ago quit  Substance Use Topics   Alcohol use: No   Drug use: No    Home Medications Prior to Admission medications   Medication Sig Start Date End Date Taking? Authorizing Provider  aspirin EC 81 MG tablet Take  162 mg by mouth daily.     [provider]  citalopram (CELEXA) 10 MG tablet Take 1 tablet (10 mg total) by mouth daily. 07/04/16   Patrecia Pour, NP  donepezil (ARICEPT) 5 MG tablet Take 5 mg by mouth at bedtime.    [provider]  gabapentin (NEURONTIN) 300 MG capsule Take 300 mg by mouth at bedtime.    [provider]  Melatonin 3 MG TABS Take 3 mg by mouth at bedtime.    [provider]  metroNIDAZOLE (METROGEL) 1 % gel Apply topically 2 (two) times daily. To rt groin wound/ulcer 05/11/18   Brunetta Genera, MD  traZODone (DESYREL) 50 MG tablet Take 25 mg by mouth at bedtime. 03/19/18   [provider]  vitamin B-12 (CYANOCOBALAMIN) 1000 MCG tablet Take 1,000 mcg by mouth daily.    [provider]    Allergies    Penicillins  Review of Systems   Review of Systems  Constitutional:  Negative for fever.  HENT:  Negative for sore throat.   Eyes:  Negative for pain.  Respiratory:  Negative for shortness of breath.   Cardiovascular:  Negative for chest pain.  Gastrointestinal:  Negative for abdominal pain.  Genitourinary:  Negative for flank pain.  Musculoskeletal:  Negative for back pain and neck pain.  Skin:  Negative for rash.  Neurological:  Negative for headaches.  Hematological:  Does not bruise/bleed easily.  Psychiatric/Behavioral:  Negative for agitation.    Physical Exam Updated Vital Signs Pulse (!) 31    Resp (!) 21    SpO2 98%   Physical Exam Vitals and nursing note reviewed.  Constitutional:      Appearance: Normal appearance. He is well-developed.  HENT:     Head: Atraumatic.     Nose: Nose normal.     Mouth/Throat:     Mouth: Mucous membranes are moist.     Pharynx: Oropharynx is clear.  Eyes:     General: No scleral icterus.    Conjunctiva/sclera: Conjunctivae normal.     Pupils: Pupils are equal, round, and reactive to light.  Neck:     Vascular: No carotid bruit.     Trachea: No tracheal  deviation.  Cardiovascular:     Rate and Rhythm: Regular rhythm. Bradycardia present.     Pulses: Normal pulses.     Heart sounds: Normal heart sounds. No murmur heard.   No friction rub. No gallop.  Pulmonary:     Effort: Pulmonary effort is normal. No accessory muscle usage or respiratory distress.     Breath sounds: Normal breath sounds.  Abdominal:     General: Bowel sounds are normal. There is no distension.     Palpations: Abdomen is soft.     Tenderness: There is no abdominal tenderness.  Genitourinary:    Comments: No cva tenderness. Musculoskeletal:        General: No swelling or tenderness.     Cervical back: Normal range of motion and neck supple. No rigidity.     Right lower leg: No edema.     Left lower leg: No edema.     Comments: CTLS spine, non tender, aligned, no step off. Good rom bilateral extremities without pain or focal bony tenderness.   Skin:    General: Skin is warm and dry.     Findings: No rash.  Neurological:     Mental Status: He is alert.     Comments: Alert, speech clear. Motor/sens grossly intact bil.   Psychiatric:        Mood and Affect: Mood normal.    ED Results / Procedures / Treatments   Labs (all labs ordered are listed, but only abnormal results are displayed) Results for orders placed or performed during the hospital encounter of 03/08/21  Resp Panel by RT-PCR (Flu A&B, Covid) Nasopharyngeal Swab   Specimen: Nasopharyngeal Swab; Nasopharyngeal(NP) swabs in vial transport medium  Result Value Ref Range   SARS Coronavirus 2 by RT PCR NEGATIVE NEGATIVE   Influenza A by PCR NEGATIVE NEGATIVE   Influenza B by PCR NEGATIVE NEGATIVE  CBC  Result Value Ref Range   WBC 16.7 (H) 4.0 - 10.5 K/uL   RBC 3.81 (L) 4.22 - 5.81 MIL/uL   Hemoglobin 10.7 (L) 13.0 - 17.0 g/dL   HCT 34.3 (L) 39.0 - 52.0 %   MCV 90.0 80.0 - 100.0 fL   MCH 28.1 26.0 - 34.0 pg   MCHC 31.2 30.0 - 36.0 g/dL   RDW 15.1 11.5 - 15.5 %   Platelets 217 150 - 400 K/uL    nRBC 0.0 0.0 - 0.2 %  Comprehensive metabolic panel  Result Value Ref Range   Sodium 142 135 - 145 mmol/L  Potassium 4.6 3.5 - 5.1 mmol/L   Chloride 108 98 - 111 mmol/L   CO2 21 (L) 22 - 32 mmol/L   Glucose, Bld 143 (H) 70 - 99 mg/dL   BUN 37 (H) 8 - 23 mg/dL   Creatinine, Ser 1.68 (H) 0.61 - 1.24 mg/dL   Calcium 9.0 8.9 - 10.3 mg/dL   Total Protein 6.9 6.5 - 8.1 g/dL   Albumin 3.2 (L) 3.5 - 5.0 g/dL   AST 59 (H) 15 - 41 U/L   ALT 43 0 - 44 U/L   Alkaline Phosphatase 67 38 - 126 U/L   Total Bilirubin 0.8 0.3 - 1.2 mg/dL   GFR, Estimated 37 (L) >60 mL/min   Anion gap 13 5 - 15  I-stat chem 8, ED  Result Value Ref Range   Sodium 143 135 - 145 mmol/L   Potassium 4.5 3.5 - 5.1 mmol/L   Chloride 109 98 - 111 mmol/L   BUN 38 (H) 8 - 23 mg/dL   Creatinine, Ser 1.40 (H) 0.61 - 1.24 mg/dL   Glucose, Bld 135 (H) 70 - 99 mg/dL   Calcium, Ion 1.14 (L) 1.15 - 1.40 mmol/L   TCO2 21 (L) 22 - 32 mmol/L   Hemoglobin 11.2 (L) 13.0 - 17.0 g/dL   HCT 33.0 (L) 39.0 - 52.0 %     EKG EKG Interpretation  Date/Time:  Saturday March 08 2021 11:02:00 EST Ventricular Rate:  33 PR Interval:  198 QRS Duration: 152 QT Interval:  673 QTC Calculation: 499 R Axis:   -33 Text Interpretation: Complete (3-degree) AV block Confirmed by Lajean Saver [phone]) on 03/08/2021 11:09:39 AM  Radiology DG Chest Port 1 View  Result Date: 03/08/2021 CLINICAL DATA:  87 year old male with history of shortness of breath. Unwitnessed fall. EXAM: PORTABLE CHEST 1 VIEW COMPARISON:  Chest x-ray 07/02/2016. FINDINGS: Airspace consolidation in the right lower lobe concerning for pneumonia. Small right pleural effusion. Left lung is clear. No left pleural effusion. No pneumothorax. No evidence of pulmonary edema. Heart size is normal. Upper mediastinal contours are unremarkable. Atherosclerotic calcifications in the thoracic aorta. IMPRESSION: 1. Right lower lobe pneumonia with small right parapneumonic pleural effusion.  2. Aortic atherosclerosis. Electronically Signed   By: Vinnie Langton M.D.   On: 03/08/2021 12:42    Procedures Procedures   Medications Ordered in ED Medications - No data to display  ED Course  I have reviewed the triage vital signs and the nursing notes.  Pertinent labs & imaging results that were available during my care of the patient were reviewed by me and considered in my medical decision making (see chart for details).    MDM Rules/Calculators/A&P                         Iv ns. Continuous pulse ox and cardiac monitoring. Ecg. Stat labs. Pcxr.   Reviewed nursing notes and prior charts for additional history.   External pacer pad applied.   Patient is a grand old age, and does have DNR on chart. Pt with living will in Epic - not entirely clear from records and/or talking w patient whether would consider pacemaker/intervention.   I spoke with son, indicates patients wishes, discussion with him have been clear - he indicates comfort/supportive care, no interventions or procedures, would not consider pacemaker or other procedural intervention, indicates has lymphoma and had decided no treatment for lymphoma. Pt/family would like medical care, indicating ivfs, meds,, comfort measures ok.  Labs reviewed/interpreted by me - k normal.   Recheck pt, alert, content appearing, denies pain.   Given aki, CHB, etc. Will consult medicine for admission. Will also consult palliative care team ?whether transition to inpatient hospice setting would be possible.   CXR reviewed/interpreted by me - ?pna. Iv abx given.  CRITICAL CARE RE: weakness with complete heart block/bradycardia, aki, runs of v  tach.  Performed by: Mirna Mires Total critical care time: 45 minutes Critical care time was exclusive of separately billable procedures and treating other patients. Critical care was necessary to treat or prevent imminent or life-threatening deterioration. Critical care was time spent  personally by me on the following activities: development of treatment plan with patient and/or surrogate as well as nursing, discussions with consultants, evaluation of patient's response to treatment, examination of patient, obtaining history from patient or surrogate, ordering and performing treatments and interventions, ordering and review of laboratory studies, ordering and review of radiographic studies, pulse oximetry and re-evaluation of patient's condition.  Discussed pt with palliative care team - they will be by to consult on patient.   Final Clinical Impression(s) / ED Diagnoses Final diagnoses:  None    Rx / DC Orders ED Discharge Orders     None            Lajean Saver, MD 03/08/21 1514

## 2021-03-08 NOTE — ED Notes (Signed)
[place] with 3LPM O2 applied to pt

## 2021-03-08 NOTE — Progress Notes (Signed)
Patient had another episode of unresponsiveness at CT scan, and then recovered consciousness spontaneously.  Nurse report patient continues to pull wires, will discontinue telemetry monitoring for now.

## 2021-03-08 NOTE — ED Notes (Signed)
Charge RN notified of patients agitation and restlessness and trying to get out of the bed as well as admitting doc and staffing.  Order has been placed for a Air cabin crew but there is none available.

## 2021-03-09 DIAGNOSIS — Z66 Do not resuscitate: Secondary | ICD-10-CM

## 2021-03-09 DIAGNOSIS — J690 Pneumonitis due to inhalation of food and vomit: Secondary | ICD-10-CM | POA: Diagnosis present

## 2021-03-09 DIAGNOSIS — G9340 Encephalopathy, unspecified: Secondary | ICD-10-CM | POA: Diagnosis present

## 2021-03-09 DIAGNOSIS — R451 Restlessness and agitation: Secondary | ICD-10-CM

## 2021-03-09 DIAGNOSIS — E860 Dehydration: Secondary | ICD-10-CM | POA: Diagnosis present

## 2021-03-09 DIAGNOSIS — Z789 Other specified health status: Secondary | ICD-10-CM

## 2021-03-09 DIAGNOSIS — Z515 Encounter for palliative care: Secondary | ICD-10-CM

## 2021-03-09 DIAGNOSIS — N179 Acute kidney failure, unspecified: Secondary | ICD-10-CM | POA: Diagnosis present

## 2021-03-09 DIAGNOSIS — C8590 Non-Hodgkin lymphoma, unspecified, unspecified site: Secondary | ICD-10-CM | POA: Diagnosis present

## 2021-03-09 DIAGNOSIS — R627 Adult failure to thrive: Secondary | ICD-10-CM | POA: Diagnosis present

## 2021-03-09 DIAGNOSIS — I10 Essential (primary) hypertension: Secondary | ICD-10-CM | POA: Diagnosis present

## 2021-03-09 DIAGNOSIS — Z7189 Other specified counseling: Secondary | ICD-10-CM

## 2021-03-09 DIAGNOSIS — F419 Anxiety disorder, unspecified: Secondary | ICD-10-CM | POA: Diagnosis present

## 2021-03-09 MED ORDER — BIOTENE DRY MOUTH MT LIQD
15.0000 mL | Freq: Two times a day (BID) | OROMUCOSAL | Status: DC
  Administered 2021-03-09: 15 mL via TOPICAL

## 2021-03-09 MED ORDER — POLYVINYL ALCOHOL 1.4 % OP SOLN
1.0000 [drp] | Freq: Four times a day (QID) | OPHTHALMIC | Status: DC | PRN
  Filled 2021-03-09: qty 15

## 2021-03-09 MED ORDER — ACETAMINOPHEN 325 MG PO TABS: 650.0000 mg | ORAL_TABLET | Freq: Four times a day (QID) | ORAL | Status: DC | PRN

## 2021-03-09 MED ORDER — OXYCODONE HCL 20 MG/ML PO CONC: 5.0000 mg | ORAL | Status: DC | PRN

## 2021-03-09 MED ORDER — LORAZEPAM 2 MG/ML PO CONC: 1.0000 mg | ORAL | Status: DC | PRN

## 2021-03-09 MED ORDER — LORAZEPAM 2 MG/ML IJ SOLN: 1.0000 mg | INTRAMUSCULAR | Status: DC | PRN

## 2021-03-09 MED ORDER — ACETAMINOPHEN 650 MG RE SUPP: 650.0000 mg | Freq: Four times a day (QID) | RECTAL | Status: DC | PRN

## 2021-03-09 MED ORDER — MAGIC MOUTHWASH
15.0000 mL | Freq: Four times a day (QID) | ORAL | Status: DC | PRN
  Filled 2021-03-09: qty 15

## 2021-03-09 MED ORDER — GLYCOPYRROLATE 0.2 MG/ML IJ SOLN
0.2000 mg | INTRAMUSCULAR | Status: DC | PRN
  Administered 2021-03-09 – 2021-03-10 (×2): 0.2 mg via INTRAVENOUS
  Filled 2021-03-09 (×2): qty 1

## 2021-03-09 MED ORDER — HALOPERIDOL LACTATE 5 MG/ML IJ SOLN
1.0000 mg | INTRAMUSCULAR | Status: DC
  Administered 2021-03-09 – 2021-03-10 (×5): 1 mg via INTRAVENOUS
  Filled 2021-03-09 (×6): qty 1

## 2021-03-09 MED ORDER — LORAZEPAM 1 MG PO TABS: 1.0000 mg | ORAL_TABLET | ORAL | Status: DC | PRN

## 2021-03-09 MED ORDER — GLYCOPYRROLATE 0.2 MG/ML IJ SOLN: 0.2000 mg | INTRAMUSCULAR | Status: DC | PRN

## 2021-03-09 MED ORDER — IPRATROPIUM-ALBUTEROL 0.5-2.5 (3) MG/3ML IN SOLN: 3.0000 mL | Freq: Four times a day (QID) | RESPIRATORY_TRACT | Status: DC | PRN

## 2021-03-09 MED ORDER — HALOPERIDOL LACTATE 5 MG/ML IJ SOLN
2.0000 mg | INTRAMUSCULAR | Status: DC | PRN
  Administered 2021-03-09 – 2021-03-10 (×2): 2 mg via INTRAVENOUS
  Filled 2021-03-09: qty 1

## 2021-03-09 MED ORDER — LORAZEPAM 2 MG/ML IJ SOLN
1.0000 mg | INTRAMUSCULAR | Status: DC | PRN
  Administered 2021-03-09: 1 mg via INTRAVENOUS

## 2021-03-09 MED ORDER — ONDANSETRON 4 MG PO TBDP: 4.0000 mg | ORAL_TABLET | Freq: Four times a day (QID) | ORAL | Status: DC | PRN

## 2021-03-09 MED ORDER — ONDANSETRON HCL 4 MG/2ML IJ SOLN: 4.0000 mg | Freq: Four times a day (QID) | INTRAMUSCULAR | Status: DC | PRN

## 2021-03-09 MED ORDER — LORAZEPAM 2 MG/ML IJ SOLN
1.0000 mg | INTRAMUSCULAR | Status: DC | PRN
  Filled 2021-03-09: qty 1

## 2021-03-09 MED ORDER — HYDROMORPHONE HCL 1 MG/ML IJ SOLN: 0.5000 mg | INTRAMUSCULAR | Status: DC | PRN

## 2021-03-09 MED ORDER — HALOPERIDOL 1 MG PO TABS
2.0000 mg | ORAL_TABLET | ORAL | Status: DC | PRN
  Filled 2021-03-09: qty 2

## 2021-03-09 MED ORDER — HYDROMORPHONE HCL 1 MG/ML IJ SOLN
0.5000 mg | INTRAMUSCULAR | Status: DC | PRN
  Administered 2021-03-10: 1 mg via INTRAVENOUS
  Filled 2021-03-09 (×2): qty 1

## 2021-03-09 MED ORDER — HALOPERIDOL LACTATE 2 MG/ML PO CONC
2.0000 mg | ORAL | Status: DC | PRN
  Filled 2021-03-09: qty 1

## 2021-03-09 MED ORDER — GLYCOPYRROLATE 1 MG PO TABS
1.0000 mg | ORAL_TABLET | ORAL | Status: DC | PRN
  Filled 2021-03-09: qty 1

## 2021-03-09 MED ORDER — IPRATROPIUM-ALBUTEROL 0.5-2.5 (3) MG/3ML IN SOLN
3.0000 mL | Freq: Three times a day (TID) | RESPIRATORY_TRACT | Status: DC
  Filled 2021-03-09: qty 3

## 2021-03-09 MED ORDER — LORAZEPAM 2 MG/ML IJ SOLN
1.0000 mg | INTRAMUSCULAR | Status: DC
  Administered 2021-03-09 – 2021-03-10 (×6): 1 mg via INTRAVENOUS
  Filled 2021-03-09 (×6): qty 1

## 2021-03-09 MED ORDER — DIPHENHYDRAMINE HCL 50 MG/ML IJ SOLN: 12.5000 mg | INTRAMUSCULAR | Status: DC | PRN

## 2021-03-09 NOTE — Assessment & Plan Note (Signed)
Patient appears to be confused, this is likely combination of delirium in the setting of dementia, pneumonia, AKI as well as metabolic from dehydration. Manage agitation with medication right now. This is placing the patient at high risk for poor outcome. Currently comfort care.  Avoid restraints.

## 2021-03-09 NOTE — Assessment & Plan Note (Signed)
Presents with progressive weakness and fatigue and confusion. EKG shows evidence of NSVT as well as bradycardia consistent with third-degree heart block. Patient family does not want any aggressive intervention procedures or implantation or surgeries. Based on this recommended family to consider option of comfort care with residential hospice which they are in agreement.

## 2021-03-09 NOTE — ED Notes (Signed)
Pt coughing up thick green yellow sputum s/p breathing TX. Pt given bag to spit into. Suctioning provided.

## 2021-03-09 NOTE — Assessment & Plan Note (Signed)
Chest x-ray shows right lower lobe pneumonia. Consistent with aspiration etiology in the setting of dementia. Has leukocytosis as well. Placing the patient at high risk for poor outcome. Family would not want any life prolonging measures.  Currently comfort care.

## 2021-03-09 NOTE — ED Notes (Addendum)
Pt repositioned in bed & a brief put back on him that he keeps removing.

## 2021-03-09 NOTE — Assessment & Plan Note (Signed)
On Seroquel and other psychotropic medications.  We will continue the same although more likely will be using injectable medication given patient's ongoing confusion.

## 2021-03-09 NOTE — Progress Notes (Signed)
°  Progress Note   Patient: Johnny May YHC:[id number] DOB: 03-20-1923 DOA: 03/08/2021     1 DOS: the patient was seen and examined on 03/09/2021   Brief hospital course: No notes on file  Assessment and Plan * Third degree AV block ([place])- (present on admission) Presents with progressive weakness and fatigue and confusion. EKG shows evidence of NSVT as well as bradycardia consistent with third-degree heart block. Patient family does not want any aggressive intervention procedures or implantation or surgeries. Based on this recommended family to consider option of comfort care with residential hospice which they are in agreement.  Acute encephalopathy- (present on admission) Patient appears to be confused, this is likely combination of delirium in the setting of dementia, pneumonia, AKI as well as metabolic from dehydration. Manage agitation with medication right now. This is placing the patient at high risk for poor outcome. Currently comfort care.  Avoid restraints.  AKI (acute kidney injury) ([place])- (present on admission) From poor p.o. intake as well as failure to thrive. Progressive decline over last 1 month. Baseline serum creatinine around 1.1.  On presentation serum creatinine 1.4 currently worsening to 1.68. Now comfort care.  Dehydration- (present on admission) From poor p.o. intake secondary to progressive dementia and decline.  Monitor.  Aspiration pneumonia ([place])- (present on admission) Chest x-ray shows right lower lobe pneumonia. Consistent with aspiration etiology in the setting of dementia. Has leukocytosis as well. Placing the patient at high risk for poor outcome. Family would not want any life prolonging measures.  Currently comfort care.  Failure to thrive in adult- (present on admission) In the setting of dementia. Patient is from [place].  Memory care unit.  Progressive declining over last 2 months.  Significantly worsened since Thanksgiving.   Family would like to transition to comfort care.  Anxiety- (present on admission) Continue Ativan as needed.  Accelerated hypertension- (present on admission) Blood pressure mildly elevated. From agitation. Monitor.   Moderate dementia with behavioral disturbance- (present on admission) On Seroquel and other psychotropic medications.  We will continue the same although more likely will be using injectable medication given patient's ongoing confusion.  Non Hodgkin's lymphoma ([place])- (present on admission) Placing the patient at high risk of poor outcome. Currently in remission.     Subjective: No nausea no vomiting.  No fever no chills.  Agitated.  Currently restrained.  Objective Bradycardic, tachypneic and hypertensive General: Appear in mild distress, no Rash; Oral Mucosa dry with secretion crusted. no Abnormal Neck Mass Or lumps, Conjunctiva normal  Cardiovascular: S1 and S2 Present, aortic systolic  Murmur, Respiratory: Increased basal respiratory effort, Bilateral Air entry present and CTA, no Crackles, no wheezes Abdomen: Bowel Sound present, Soft and no tenderness Extremities: no Pedal edema Neurology: alert and not oriented to time, place, and person, incoherent speech, unable to follow commands affect agitated. no new focal deficit Gait not checked due to patient safety concerns    Data Reviewed: Worsening renal function, leukocytosis.  Family Communication: Discussed with son on the phone.  Disposition: Status is: Inpatient  Remains inpatient appropriate because: Poor prognosis.  Likely residential hospice candidate.     Time spent: 60 minutes  Author: Berle Mull 03/09/2021 7:41 PM  For on call review [email or web address].

## 2021-03-09 NOTE — Assessment & Plan Note (Signed)
Blood pressure mildly elevated. From agitation. Monitor.

## 2021-03-09 NOTE — ED Notes (Signed)
Pt will not hold his limb still for an accurate BP to be taken, staff attempted several times with his monitor on more than 3 limbs & with a Dinamap as well, only one BP accumulated & that is what was documented.

## 2021-03-09 NOTE — Assessment & Plan Note (Signed)
Placing the patient at high risk of poor outcome. Currently in remission.

## 2021-03-09 NOTE — Assessment & Plan Note (Signed)
From poor p.o. intake as well as failure to thrive. Progressive decline over last 1 month. Baseline serum creatinine around 1.1.  On presentation serum creatinine 1.4 currently worsening to 1.68. Now comfort care.

## 2021-03-09 NOTE — Assessment & Plan Note (Signed)
-   Continue Ativan as needed 

## 2021-03-09 NOTE — Assessment & Plan Note (Signed)
From poor p.o. intake secondary to progressive dementia and decline.  Monitor.

## 2021-03-09 NOTE — ED Notes (Signed)
Pt found sideways on bed, naked, with feces on hands, body and bed. Pt and bed cleaned. Sheets, chux, diapers replaced. Restraints reapplied.

## 2021-03-09 NOTE — Consult Note (Addendum)
Consultation Note Date: 03/09/2021   Patient Name: Johnny May  DOB: [birth year]  MRN: [id number]  Age / Sex: 88 y.o., male  PCP: Johnny Poll, MD Referring Physician: Lavina Hamman, MD  Reason for Consultation: Disposition, Establishing goals of care, Non pain symptom management, Pain control, Psychosocial/spiritual support, and Terminal Care  HPI/Patient Profile: 88 y.o. male  with past medical history of non-Hodgkin's lymphoma in remission, advanced dementia, anxiety/depression presented to ED on 03/08/21 from [facility] after an unwitnessed fall. Patient was admitted on 03/08/2021 with third-degree AV block, nonsustained v.tach, right lower lobe bacterial pneumonia, delirium, dehydration, s/p fall.   ED Course: Remained bradycardia heart rate in the 30s.  Afebrile, blood pressure started to 120s, chest x-ray showed right lower lobe pneumonia.   Family arrived and discussed with ED physician, desires no aggressive measures including pacemaker.  Clinical Assessment and Goals of Care: I have reviewed medical records including EPIC notes, labs, and imaging. Received report from primary RN - acute concern of agitation/restlessness. Patient is not eating/drinking.  Went to visit patient at bedside - no family/visitors present. Patient was lying in bed - he is restless in bed with soft restraints in place. He does not acknowledge or respond to voice/gentle touch - he does not open his eyes. No respiratory distress, increased work of breathing, or secretions noted.   Met with son/Johnny May and his wife via speakerphone  to discuss diagnosis, prognosis, GOC, EOL wishes, disposition, and options.  I introduced Palliative Medicine as specialized medical care for people living with serious illness. It focuses on providing relief from the symptoms and stress of a serious illness. The goal is to improve  quality of life for both the patient and the family.  We discussed a brief life review of the patient as well as functional and nutritional status. Family reflect on the significant decline the patient has had over the last two months at [facility] - to the point he is no longer able to dress/bathe himself.  We discussed patient's current illness and what it means in the larger context of patient's on-going co-morbidities. Family have a clear understanding of patient's current acute medical situation. Family also understands that dementia is a progressive, non-curable disease underlying the patient's current acute medical conditions. Natural disease trajectory and expectations at EOL were discussed. I attempted to elicit values and goals of care important to the patient. The difference between aggressive medical intervention and comfort care was considered in light of the patient's goals of care.   We talked about transition to comfort measures in house and what that would entail inclusive of medications to control pain, dyspnea, agitation, nausea, and itching. We discussed stopping all unnecessary measures such as blood draws, needle sticks, oxygen, antibiotics, CBGs/insulin, cardiac monitoring, IVF, and frequent vital signs. Family are agreeable to full comfort measures today. Goal is to have patient less agitated/restless to be able to remove restraints.  Family express interest in [facility] transfer - will place [facility] consult. If [facility]  may take a while" family are open to pursue other facilities.  Discussed with family the importance of continued conversation with each other and the medical providers regarding overall plan of care and treatment options, ensuring decisions are within the context of the patients values and GOCs.    Questions and concerns were addressed. The patient/family was encouraged to call with questions and/or concerns. PMT number was provided.  Discussed case  with TOC and placed consult - hospice facilities are not accepting referrals today. Will place orders for bed request on 6N.   Primary Decision Maker: NEXT OF KIN - son/Johnny May    SUMMARY OF RECOMMENDATIONS   Continue full comfort measures Continue DNR/DNI as previously documented Family request patient transfer to [facility]; however, if it "may take a while," they are open to other facilities. Hospice facilities are not accepting referrals today per [facility] - will place transfer orders for 6N and TOC will send referral tomorrow Remove restraints  Added orders for EOL symptom management and to reflect full comfort measures, as well as discontinued orders that were not focused on comfort Scheduled ativan and haldol q4h for agitation/restlessness Unrestricted visitation orders were placed per current [place] EOL visitation policy  Nursing to provide frequent assessments and administer PRN medications as clinically necessary to ensure EOL comfort PMT will continue to follow and support holistically  Code Status/Advance Care Planning: DNR  Palliative Prophylaxis:  Aspiration, Bowel Regimen, Delirium Protocol, Frequent Pain Assessment, Oral Care, and Turn Reposition  Additional Recommendations (Limitations, Scope, Preferences): Full Comfort Care  Psycho-social/Spiritual:  Created space and opportunity for patient and family to express thoughts and feelings regarding patient's current medical situation.  Emotional support and therapeutic listening provided.  Prognosis:  < 2 weeks  Discharge Planning: Hospice facility      Primary Diagnoses: Present on Admission:  Heart block AV third degree ([place])   I have reviewed the medical record, interviewed the patient and family, and examined the patient. The following aspects are pertinent.  Past Medical History:  Diagnosis Date   Anxiety    BPH (benign prostatic hyperplasia)    Hyperlipidemia    Hypertension     Memory loss    Osteoarthritis    RLS (restless legs syndrome)    Senile purpura (HCC)    Vitamin B12 deficiency    Social History   Socioeconomic History   Marital status: Widowed    Spouse name: Not on file   Number of children: Not on file   Years of education: Not on file   Highest education level: Not on file  Occupational History   Not on file  Tobacco Use   Smoking status: Former    Packs/day: 1.00    Types: Cigarettes    Quit date: 03/09/1966    Years since quitting: 55.0   Smokeless tobacco: Never   Tobacco comments:    50 years ago quit  Substance and Sexual Activity   Alcohol use: No   Drug use: No   Sexual activity: Not on file  Other Topics Concern   Not on file  Social History Narrative   Pt lives at home alone. Widow as of 2018.  One son. Retired.     Caffeine 1 drink daily.  Is driving.  Education HS.   2 cups coffee daily.   Social Determinants of Health   Financial Resource Strain: Not on file  Food Insecurity: Not on file  Transportation Needs: Not on file  Physical Activity:  Not on file  Stress: Not on file  Social Connections: Not on file   Family History  Problem Relation Age of Onset   Heart disease Mother    Heart disease Father    Scheduled Meds:  [START ON 03/10/2021] citalopram  10 mg Oral Q M,W,F   donepezil  5 mg Oral QHS   ipratropium-albuterol  3 mL Nebulization Q6H   QUEtiapine  12.5 mg Oral QHS   traZODone  25 mg Oral QHS   Continuous Infusions: PRN Meds:.acetaminophen **OR** acetaminophen, diphenhydrAMINE, glycopyrrolate **OR** glycopyrrolate **OR** glycopyrrolate, haloperidol **OR** haloperidol **OR** haloperidol lactate, HYDROmorphone (DILAUDID) injection, LORazepam **OR** LORazepam **OR** LORazepam, LORazepam, magic mouthwash, ondansetron **OR** ondansetron (ZOFRAN) IV, oxyCODONE **OR** oxyCODONE Medications Prior to Admission:  Prior to Admission medications   Medication Sig Start Date End Date Taking? Authorizing Provider   acetaminophen (TYLENOL) 325 MG tablet Take 650 mg by mouth every 6 (six) hours as needed for mild pain.   Yes [provider]  aspirin EC 81 MG tablet Take 162 mg by mouth every other day.   Yes [provider]  citalopram (CELEXA) 10 MG tablet Take 1 tablet (10 mg total) by mouth daily. Patient taking differently: Take 5 mg by mouth 3 (three) times a week. Monday Wednesday Friday 07/04/16  Yes Patrecia Pour, NP  donepezil (ARICEPT) 5 MG tablet Take 5 mg by mouth at bedtime.   Yes [provider]  QUEtiapine (SEROQUEL) 25 MG tablet Take 12.5 mg by mouth at bedtime. 02/28/21  Yes [provider]  vitamin B-12 (CYANOCOBALAMIN) 1000 MCG tablet Take 1,000 mcg by mouth daily.   Yes [provider]  gabapentin (NEURONTIN) 300 MG capsule Take 300 mg by mouth at bedtime. Patient not taking: Reported on 03/08/2021    [provider]  Melatonin 3 MG TABS Take 3 mg by mouth at bedtime. Patient not taking: Reported on 03/08/2021    [provider]  traZODone (DESYREL) 50 MG tablet Take 25 mg by mouth at bedtime. Patient not taking: Reported on 03/08/2021 03/19/18   [provider]   Allergies  Allergen Reactions   Penicillins Rash    Per pt. When in the [employer] had a little breaking out,but not bad   Review of Systems  Unable to perform ROS: Acuity of condition   Physical Exam Vitals and nursing note reviewed.  Constitutional:      General: He is not in acute distress.    Appearance: He is ill-appearing.  Pulmonary:     Effort: No respiratory distress.  Skin:    General: Skin is warm and dry.  Neurological:     Mental Status: He is lethargic.     Motor: Weakness present.  Psychiatric:        Mood and Affect: Mood is anxious.        Speech: He is noncommunicative.        Behavior: Behavior is hyperactive.        Judgment: Judgment is impulsive.    Vital Signs: BP 140/65 (BP Location: Right Arm)    Pulse 65    Temp 99.5  F (37.5 C) (Oral)    Resp 20    SpO2 96%  Pain Scale: Faces   Pain Score: 0-No pain   SpO2: SpO2: 96 % O2 Device:SpO2: 96 % O2 Flow Rate: .   IO: Intake/output summary:  Intake/Output Summary (Last 24 hours) at 03/09/2021 1205 Last data filed at 03/08/2021 1653 Gross per 24 hour  Intake  100 ml  Output --  Net 100 ml    LBM:   Baseline Weight:   Most recent weight:       Palliative Assessment/Data: PPS 10%     Time In: 1105 Time Out: 1220 Time Total: 75 minutes  Greater than 50%  of this time was spent counseling and coordinating care related to the above assessment and plan.  Signed by: Lin Landsman, NP   Please contact Palliative Medicine Team phone at [phone] for questions and concerns.  For individual provider: See Shea Evans

## 2021-03-09 NOTE — Assessment & Plan Note (Signed)
In the setting of dementia. Patient is from [facility].  Memory care unit.  Progressive declining over last 2 months.  Significantly worsened since Thanksgiving.  Family would like to transition to comfort care.

## 2021-03-10 DIAGNOSIS — J690 Pneumonitis due to inhalation of food and vomit: Secondary | ICD-10-CM

## 2021-03-10 DIAGNOSIS — R627 Adult failure to thrive: Secondary | ICD-10-CM

## 2021-03-10 DIAGNOSIS — G9340 Encephalopathy, unspecified: Secondary | ICD-10-CM

## 2021-03-10 DIAGNOSIS — N179 Acute kidney failure, unspecified: Secondary | ICD-10-CM

## 2021-03-10 DIAGNOSIS — F03B18 Unspecified dementia, moderate, with other behavioral disturbance: Secondary | ICD-10-CM

## 2021-03-10 NOTE — Discharge Summary (Signed)
Physician Discharge Summary  DRAEDYN WEIDINGER DGL:[id number] DOB: Aug 30, 1923 DOA: 03/08/2021  PCP: Reymundo Poll, MD  Admit date: 03/08/2021 Discharge date: 03/10/2021  Admitted From: Home Disposition: Residential hospice  Discharge Condition:Stable CODE STATUS: Comfort Care Diet recommendation:  Regular   Brief/Interim Summary:  Johnny May is a 88 y.o. male with medical history significant of non-Hodgkin's lymphoma in remission, advanced dementia, anxiety/depression, presented with unwitnessed fall.  When EMS arrived, he was found to be bradycardic with a heart rate in the 30s.  Chest x-ray showed right lower lobe pneumonia.  Patient was found to be in third-degree AV block.  After extensive goals of care discussion with family, patient was started on comfort care.  Plan is to transfer him to residential hospice.  Following problems were addressed during his hospitalization:  Third degree AV block ([place])- (present on admission) Presents with progressive weakness and fatigue and confusion. EKG shows evidence of NSVT as well as bradycardia consistent with third-degree heart block. Patient family does not want any aggressive intervention procedures or implantation or surgeries. Based on this recommended family to consider option of comfort care with residential hospice which they are in agreement.   Acute encephalopathy- (present on admission) Patient appears to be confused, this is likely combination of delirium in the setting of dementia, pneumonia, AKI as well as metabolic from dehydration. Currently comfort care.   AKI (acute kidney injury) ([place])- (present on admission) From poor p.o. intake as well as failure to thrive. Progressive decline over last 1 month. Baseline serum creatinine around 1.1.  On presentation serum creatinine 1.4 currently worsening to 1.68. Now comfort care.   Dehydration- (present on admission) From poor p.o. intake secondary to progressive dementia and  decline.   Aspiration pneumonia ([place])- (present on admission) Chest x-ray shows right lower lobe pneumonia. Consistent with aspiration etiology in the setting of dementia. Has leukocytosis as well. Placing the patient at high risk for poor outcome. Family would not want any life prolonging measures.  Currently comfort care.   Failure to thrive in adult- (present on admission) In the setting of dementia. Patient is from [place].  Memory care unit.  Progressive declining over last 2 months.  Significantly worsened since Thanksgiving.  Family would like to transition to comfort care.   Anxiety- (present on admission) Continue Ativan as needed.   Accelerated hypertension- (present on admission) On comfort care   Moderate dementia with behavioral disturbance- (present on admission) On Seroquel and other psychotropic medications at home   Non Hodgkin's lymphoma ([place])- (present on admission) Currently in remission.   Discharge Diagnoses:  Principal Problem:   Third degree AV block (HCC) Active Problems:   Moderate dementia with behavioral disturbance   Dehydration   AKI (acute kidney injury) ([place])   Accelerated hypertension   Non Hodgkin's lymphoma (HCC)   Anxiety   Aspiration pneumonia ([place])   Acute encephalopathy   Failure to thrive in adult    Discharge Instructions   Allergies as of 03/10/2021       Reactions   Penicillins Rash   Per pt. When in the [employer] had a little breaking out,but not bad        Medication List     STOP taking these medications    acetaminophen 325 MG tablet Commonly known as: TYLENOL   aspirin EC 81 MG tablet   citalopram 10 MG tablet Commonly known as: CELEXA   donepezil 5 MG tablet Commonly known as: ARICEPT   gabapentin 300 MG  capsule Commonly known as: NEURONTIN   melatonin 3 MG Tabs tablet   QUEtiapine 25 MG tablet Commonly known as: SEROQUEL   traZODone 50 MG tablet Commonly known as: DESYREL   vitamin  B-12 1000 MCG tablet Commonly known as: CYANOCOBALAMIN        Allergies  Allergen Reactions   Penicillins Rash    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 EXAM: CT HEAD WITHOUT CONTRAST CT CERVICAL SPINE WITHOUT CONTRAST TECHNIQUE: Multidetector CT imaging of the head and cervical spine was performed following the standard protocol without intravenous contrast. Multiplanar CT image reconstructions of the cervical spine were also generated. COMPARISON:  No priors. FINDINGS: CT HEAD FINDINGS Brain: Mild cerebral atrophy. Patchy and confluent areas of decreased attenuation are noted throughout the deep and periventricular white matter of the cerebral hemispheres bilaterally, compatible with chronic microvascular ischemic disease. No evidence of acute infarction, hemorrhage, hydrocephalus, extra-axial collection or mass lesion/mass effect. Vascular: No hyperdense vessel or unexpected calcification. Skull: Normal. Negative for fracture or focal lesion. Sinuses/Orbits: No acute finding. Other: None. CT CERVICAL SPINE FINDINGS Alignment: Normal. Skull base and vertebrae: No acute fracture. No primary bone lesion or focal pathologic process. Soft tissues and spinal canal: No prevertebral fluid or swelling. No visible canal hematoma. Disc levels: Multilevel degenerative disc disease, most pronounced at C5-C6, C6-C7 and C7-T1. Moderate multilevel facet arthropathy. Upper chest: Bilateral apical pleural-parenchymal scarring, likely secondary to remote prior infections. Other: None. IMPRESSION: 1. No evidence of significant acute traumatic injury to the skull, brain or cervical spine. 2. Mild cerebral atrophy with chronic microvascular ischemic changes in the cerebral white matter, as above. 3. Multilevel degenerative  disc disease and cervical spondylosis, as above. Electronically Signed   By: Vinnie Langton M.D.   On: 03/08/2021 16:23   CT CERVICAL SPINE WO CONTRAST  Result Date: 03/08/2021 CLINICAL DATA:  88 year old male history of minor head trauma. EXAM: CT HEAD WITHOUT CONTRAST CT CERVICAL SPINE WITHOUT CONTRAST TECHNIQUE: Multidetector CT imaging of the head and cervical spine was performed following the standard protocol without intravenous contrast. Multiplanar CT image reconstructions of the cervical spine were also generated. COMPARISON:  No priors. FINDINGS: CT HEAD FINDINGS Brain: Mild cerebral atrophy. Patchy and confluent areas of decreased attenuation are noted throughout the deep and periventricular white matter of the cerebral hemispheres bilaterally, compatible with chronic microvascular ischemic disease. No evidence of acute infarction, hemorrhage, hydrocephalus, extra-axial collection or mass lesion/mass effect. Vascular: No hyperdense vessel or unexpected calcification. Skull: Normal. Negative for fracture or focal lesion. Sinuses/Orbits: No acute finding. Other: None. CT CERVICAL SPINE FINDINGS Alignment: Normal. Skull base and vertebrae: No acute fracture. No primary bone lesion or focal pathologic process. Soft tissues and spinal canal: No prevertebral fluid or swelling. No visible canal hematoma. Disc levels: Multilevel degenerative disc disease, most pronounced at C5-C6, C6-C7 and C7-T1. Moderate multilevel facet arthropathy. Upper chest: Bilateral apical pleural-parenchymal scarring, likely secondary to remote prior infections. Other: None. IMPRESSION: 1. No evidence of significant acute traumatic injury to the skull, brain or cervical spine. 2. Mild cerebral atrophy with chronic microvascular ischemic changes in the cerebral white matter, as above. 3. Multilevel degenerative disc disease and cervical spondylosis, as above. Electronically Signed   By: Vinnie Langton M.D.   On: 03/08/2021 16:23    DG Chest  Port 1 View  Result Date: 03/08/2021 CLINICAL DATA:  88 year old male with history of shortness of breath. Unwitnessed fall. EXAM: PORTABLE CHEST 1 VIEW COMPARISON:  Chest x-ray 07/02/2016. FINDINGS: Airspace consolidation in the right lower lobe concerning for pneumonia. Small right pleural effusion. Left lung is clear. No left pleural effusion. No pneumothorax. No evidence of pulmonary edema. Heart size is normal. Upper mediastinal contours are unremarkable. Atherosclerotic calcifications in the thoracic aorta. IMPRESSION: 1. Right lower lobe pneumonia with small right parapneumonic pleural effusion. 2. Aortic atherosclerosis. Electronically Signed   By: Vinnie Langton M.D.   On: 03/08/2021 12:42      Subjective: Patient seen and examined at the bedside this morning.  Eyes closed, did not appear in any Distress.  Responds to only painful stimuli.  Discharge Exam: Vitals:   03/09/21 2040 03/10/21 0520  BP:  (!) 148/69  Pulse:  (!) 50  Resp:  16  Temp:  98.4 F (36.9 C)  SpO2: 94% 96%   Vitals:   03/09/21 1705 03/09/21 2040 03/10/21 0520 03/10/21 1118  BP: (!) 171/56  (!) 148/69   Pulse: (!) 45  (!) 50   Resp: (!) 24  16   Temp: 97.8 F (36.6 C)  98.4 F (36.9 C)   TempSrc: Oral  Tympanic   SpO2: 94% 94% 96%   Weight:    78 kg    General: Looked comfortable, lying on bed, eyes closed Cardiovascular: RRR, S1/S2 +, no rubs, no gallops Respiratory: No frank wheezing or rhonchi Abdominal: Soft,, nondistended  extremities: no edema, no cyanosis    The results of significant diagnostics from this hospitalization (including imaging, microbiology, ancillary and laboratory) are listed below for reference.     Microbiology: Recent Results (from the past 240 hour(s))  Resp Panel by RT-PCR (Flu A&B, Covid) Nasopharyngeal Swab     Status: None   Collection Time: 03/08/21 11:12 AM   Specimen: Nasopharyngeal Swab; Nasopharyngeal(NP) swabs in vial transport medium   Result Value Ref Range Status   SARS Coronavirus 2 by RT PCR NEGATIVE NEGATIVE Final    Comment: (NOTE) SARS-CoV-2 target nucleic acids are NOT DETECTED.  The SARS-CoV-2 RNA is generally detectable in upper respiratory specimens during the acute phase of infection. The lowest concentration of SARS-CoV-2 viral copies this assay can detect is 138 copies/mL. A negative result does not preclude SARS-Cov-2 infection and should not be used as the sole basis for treatment or other patient management decisions. A negative result may occur with  improper specimen collection/handling, submission of specimen other than nasopharyngeal swab, presence of viral mutation(s) within the areas targeted by this assay, and inadequate number of viral copies(<138 copies/mL). A negative result must be combined with clinical observations, patient history, and epidemiological information. The expected result is Negative.  Fact Sheet for Patients:  EntrepreneurPulse.com.au  Fact Sheet for Healthcare Providers:  IncredibleEmployment.be  This test is no t yet approved or cleared by the [place] FDA and  has been authorized for detection and/or diagnosis of SARS-CoV-2 by FDA under an Emergency Use Authorization (EUA). This EUA will remain  in effect (meaning this test can be used) for the duration of the COVID-19 declaration under Section 564(b)(1) of the Act, 21 U.S.C.section 360bbb-3(b)(1), unless the authorization is terminated  or revoked sooner.       Influenza A by PCR NEGATIVE NEGATIVE Final   Influenza B by PCR NEGATIVE NEGATIVE Final    Comment: (NOTE) The Xpert Xpress SARS-CoV-2/FLU/RSV plus assay is intended as  an aid in the diagnosis of influenza from Nasopharyngeal swab specimens and should not be used as a sole basis for treatment. Nasal washings and aspirates are unacceptable for Xpert Xpress SARS-CoV-2/FLU/RSV testing.  Fact Sheet for  Patients: EntrepreneurPulse.com.au  Fact Sheet for Healthcare Providers: IncredibleEmployment.be  This test is not yet approved or cleared by the [place] FDA and has been authorized for detection and/or diagnosis of SARS-CoV-2 by FDA under an Emergency Use Authorization (EUA). This EUA will remain in effect (meaning this test can be used) for the duration of the COVID-19 declaration under Section 564(b)(1) of the Act, 21 U.S.C. section 360bbb-3(b)(1), unless the authorization is terminated or revoked.  Performed at [facility], [place] [address] [place], [place] [address]      Labs: BNP (last 3 results) No results for input(s): BNP in the last 8760 hours. Basic Metabolic Panel: Recent Labs  Lab 03/08/21 1120 03/08/21 1131 03/08/21 1450  NA 143 142  --   K 4.5 4.6  --   CL 109 108  --   CO2  --  21*  --   GLUCOSE 135* 143*  --   BUN 38* 37*  --   CREATININE 1.40* 1.68*  --   CALCIUM  --  9.0  --   MG  --   --  2.4  PHOS  --   --  3.6   Liver Function Tests: Recent Labs  Lab 03/08/21 1131  AST 59*  ALT 43  ALKPHOS 67  BILITOT 0.8  PROT 6.9  ALBUMIN 3.2*   No results for input(s): LIPASE, AMYLASE in the last 168 hours. No results for input(s): AMMONIA in the last 168 hours. CBC: Recent Labs  Lab 03/08/21 1120 03/08/21 1131  WBC  --  16.7*  HGB 11.2* 10.7*  HCT 33.0* 34.3*  MCV  --  90.0  PLT  --  217   Cardiac Enzymes: No results for input(s): CKTOTAL, CKMB, CKMBINDEX, TROPONINI in the last 168 hours. BNP: Invalid input(s): POCBNP CBG: No results for input(s): GLUCAP in the last 168 hours. D-Dimer No results for input(s): DDIMER in the last 72 hours. Hgb A1c No results for input(s): HGBA1C in the last 72 hours. Lipid Profile No results for input(s): CHOL, HDL, LDLCALC, TRIG, CHOLHDL, LDLDIRECT in the last 72 hours. Thyroid function studies No results for input(s): TSH, T4TOTAL, T3FREE, THYROIDAB in  the last 72 hours.  Invalid input(s): FREET3 Anemia work up No results for input(s): VITAMINB12, FOLATE, FERRITIN, TIBC, IRON, RETICCTPCT in the last 72 hours. Urinalysis    Component Value Date/Time   COLORURINE YELLOW 02/12/2020 1740   APPEARANCEUR CLEAR 02/12/2020 1740   LABSPEC 1.019 02/12/2020 1740   PHURINE 5.0 02/12/2020 1740   GLUCOSEU NEGATIVE 02/12/2020 1740   HGBUR NEGATIVE 02/12/2020 1740   BILIRUBINUR NEGATIVE 02/12/2020 1740   KETONESUR NEGATIVE 02/12/2020 1740   PROTEINUR NEGATIVE 02/12/2020 1740   NITRITE NEGATIVE 02/12/2020 1740   LEUKOCYTESUR NEGATIVE 02/12/2020 1740   Sepsis Labs Invalid input(s): PROCALCITONIN,  WBC,  LACTICIDVEN Microbiology Recent Results (from the past 240 hour(s))  Resp Panel by RT-PCR (Flu A&B, Covid) Nasopharyngeal Swab     Status: None   Collection Time: 03/08/21 11:12 AM   Specimen: Nasopharyngeal Swab; Nasopharyngeal(NP) swabs in vial transport medium  Result Value Ref Range Status   SARS Coronavirus 2 by RT PCR NEGATIVE NEGATIVE Final    Comment: (NOTE) SARS-CoV-2 target nucleic acids are NOT DETECTED.  The SARS-CoV-2 RNA is generally detectable  in upper respiratory specimens during the acute phase of infection. The lowest concentration of SARS-CoV-2 viral copies this assay can detect is 138 copies/mL. A negative result does not preclude SARS-Cov-2 infection and should not be used as the sole basis for treatment or other patient management decisions. A negative result may occur with  improper specimen collection/handling, submission of specimen other than nasopharyngeal swab, presence of viral mutation(s) within the areas targeted by this assay, and inadequate number of viral copies(<138 copies/mL). A negative result must be combined with clinical observations, patient history, and epidemiological information. The expected result is Negative.  Fact Sheet for Patients:  EntrepreneurPulse.com.au  Fact Sheet  for Healthcare Providers:  IncredibleEmployment.be  This test is no t yet approved or cleared by the [place] FDA and  has been authorized for detection and/or diagnosis of SARS-CoV-2 by FDA under an Emergency Use Authorization (EUA). This EUA will remain  in effect (meaning this test can be used) for the duration of the COVID-19 declaration under Section 564(b)(1) of the Act, 21 U.S.C.section 360bbb-3(b)(1), unless the authorization is terminated  or revoked sooner.       Influenza A by PCR NEGATIVE NEGATIVE Final   Influenza B by PCR NEGATIVE NEGATIVE Final    Comment: (NOTE) The Xpert Xpress SARS-CoV-2/FLU/RSV plus assay is intended as an aid in the diagnosis of influenza from Nasopharyngeal swab specimens and should not be used as a sole basis for treatment. Nasal washings and aspirates are unacceptable for Xpert Xpress SARS-CoV-2/FLU/RSV testing.  Fact Sheet for Patients: EntrepreneurPulse.com.au  Fact Sheet for Healthcare Providers: IncredibleEmployment.be  This test is not yet approved or cleared by the [place] FDA and has been authorized for detection and/or diagnosis of SARS-CoV-2 by FDA under an Emergency Use Authorization (EUA). This EUA will remain in effect (meaning this test can be used) for the duration of the COVID-19 declaration under Section 564(b)(1) of the Act, 21 U.S.C. section 360bbb-3(b)(1), unless the authorization is terminated or revoked.  Performed at [facility], [place] [address], [place], [place] [address]     Please note: You were cared for by a hospitalist during your hospital stay. Once you are discharged, your primary care physician will handle any further medical issues. Please note that NO REFILLS for any discharge medications will be authorized once you are discharged, as it is imperative that you return to your primary care physician (or establish a relationship with a  primary care physician if you do not have one) for your post hospital discharge needs so that they can reassess your need for medications and monitor your lab values.    Time coordinating discharge: 40 minutes  SIGNED:   Shelly Coss, MD  [facility] 03/10/2021, 1:52 PM Pager [phone]  If 7PM-7AM, please contact night-coverage [email or web address] Password [email or web address]

## 2021-03-10 NOTE — Progress Notes (Signed)
Pt escorted on stretcher by EMS to [facility]. IV remained in place at the request of Verdis Frederickson, AT. Pt belongings are with him.

## 2021-03-10 NOTE — Progress Notes (Addendum)
Manufacturing engineer [facility]) Hospital Liaison Note  Received request from Transitions of Care Manager Kelly Splinter. for family interest in [facility]. Visited patient at bedside and spoke with son/Sparsh Linna Hoff) to confirm interest and explain services.  Approval for [facility] is determined by [facility] MD. Patient has been approved for [facility]. TOC/RN/NP/Family were all provided of the updates above.   Addendum Please send signed DNR form with patient and RN call report to [phone].   Please do not hesitate to call with any hospice related questions.    Thank you for the opportunity to participate in this patient's care.  Daphene Calamity, MSW [facility] Liaison  [phone]

## 2021-03-10 NOTE — TOC Initial Note (Addendum)
Transition of Care [facility]) - Initial/Assessment Note    Patient Details  Name: Johnny May MRN: [id number] Date of Birth: 1923-12-03  [facility]) CM/SW Contact:    Marilu Favre, RN Phone Number: 03/10/2021, 12:20 PM  Clinical Narrative:                  Spoke to patient's son Linna Hoff at bedside.   Patient from [facility].   Discussed residential hospice. Dan in agreement , first choice is [facility]. Lorayne Bender with [facility] aware and will met Dan at bedside in approximately 10 minutes. Dan aware    [phone] Patient has been approved for [facility]. Lorayne Bender with [facility] will keep NCM posted regarding beds.   1640 Patient has [facility] bed. [facility] wants PTAR called for 8 pm tonight. Called and paperwork on chart. Signed DNR form on chart.  Nurse to call report to  [phone].    Expected Discharge Plan: [place]     Patient Goals and CMS Choice     Choice offered to / list presented to : Adult Children (son Linna Hoff at bedside)  Expected Discharge Plan and Services Expected Discharge Plan: [place]       Living arrangements for the past 2 months:  ([facility]isted [facility] at [facility])                                      Prior Living Arrangements/Services Living arrangements for the past 2 months:  ([facility] at [facility])                     Activities of Daily Living      Permission Sought/Granted                  Emotional Assessment              Admission diagnosis:  Heart block AV third degree [facility]) [I44.2] Patient Active Problem List   Diagnosis Date Noted   Dehydration 03/09/2021   AKI (acute kidney injury) ([place]) 03/09/2021   Accelerated hypertension 03/09/2021   Non Hodgkin's lymphoma ([place]) 03/09/2021   Anxiety 03/09/2021   Aspiration pneumonia ([place]) 03/09/2021   Acute encephalopathy 03/09/2021   Failure to thrive in adult 03/09/2021   Third  degree AV block ([place]) 03/08/2021   Moderate dementia with behavioral disturbance 06/18/2016   PCP:  Reymundo Poll, MD Pharmacy:   [place], [place] [address] Phone: [phone] Fax: [phone]     Social Determinants of Health (SDOH) Interventions    Readmission Risk Interventions No flowsheet data found.

## 2021-03-10 NOTE — Progress Notes (Addendum)
Daily Progress Note   Patient Name: Johnny May       Date: 03/10/2021 DOB: 1923/09/14  Age: 88 y.o. MRN#: [id number] Attending Physician: Shelly Coss, MD Primary Care Physician: Reymundo Poll, MD Admit Date: 03/08/2021  Reason for Consultation/Follow-up: Disposition, Establishing goals of care, Non pain symptom management, Pain control, Psychosocial/spiritual support, and Terminal Care  Subjective: Chart review performed. Received report from primary RN - no acute concerns. RN states patient is lethargic, not eating/drinking.  Went to visit patient at bedside - no family/visitors present. Patient was lying in bed - he does not wake to voice/gentle touch. He is resting comfortably and much more calm than yesterday. No signs or non-verbal gestures of pain or discomfort noted. No respiratory distress or increased work of breathing; slight respiratory secretions noted.   Called son/Dan - provided updates from assessment today. Patient as been approved for hospice transfer - he is still agreeable for transfer.   All questions and concerns addressed. Encouraged to call with questions and/or concerns. PMT card provided.  Requested RN provide dose of robinul.    Length of Stay: 2  Current Medications: Scheduled Meds:   antiseptic oral rinse  15 mL Topical BID   haloperidol lactate  1 mg Intravenous Q4H   LORazepam  1 mg Intravenous Q4H    Continuous Infusions:   PRN Meds: acetaminophen **OR** acetaminophen, diphenhydrAMINE, glycopyrrolate **OR** glycopyrrolate **OR** glycopyrrolate, haloperidol **OR** haloperidol **OR** haloperidol lactate, HYDROmorphone (DILAUDID) injection, ipratropium-albuterol, LORazepam **OR** LORazepam **OR** LORazepam, magic mouthwash, ondansetron **OR**  ondansetron (ZOFRAN) IV, [DISCONTINUED] oxyCODONE **OR** oxyCODONE, polyvinyl alcohol  Physical Exam Vitals and nursing note reviewed.  Constitutional:      General: He is not in acute distress.    Appearance: He is ill-appearing.  Pulmonary:     Effort: No respiratory distress.  Skin:    General: Skin is warm and dry.  Neurological:     Mental Status: He is unresponsive.     Motor: Weakness present.  Psychiatric:        Speech: He is noncommunicative.            Vital Signs: BP (!) 148/69 (BP Location: Left Arm)    Pulse (!) 50    Temp 98.4 F (36.9 C) (Tympanic)    Resp 16    Wt 78 kg  SpO2 96%    BMI 24.67 kg/m  SpO2: SpO2: 96 % O2 Device: O2 Device: Room Air O2 Flow Rate:    Intake/output summary:  Intake/Output Summary (Last 24 hours) at 03/10/2021 1218 Last data filed at 03/10/2021 1137 Gross per 24 hour  Intake 0 ml  Output 200 ml  Net -200 ml   LBM:   Baseline Weight: Weight: 78 kg Most recent weight: Weight: 78 kg       Palliative Assessment/Data: PPS 10%    Flowsheet Rows    Flowsheet Row Most Recent Value  Intake Tab   Referral Department --  [EDP]  Unit at Time of Referral ER  Palliative Care Primary Diagnosis Cardiac  Date Notified 03/08/21  Palliative Care Type New Palliative care  Reason for referral Clarify Goals of Care, End of Life Care Assistance  Date of Admission 03/08/21  Date first seen by Palliative Care 03/09/21  # of days Palliative referral response time 1 Day(s)  # of days IP prior to Palliative referral 0  Clinical Assessment   Psychosocial & Spiritual Assessment   Palliative Care Outcomes   Patient/Family meeting held? Yes  Who was at the meeting? son, daughter in law  Palliative Care Outcomes Improved pain interventions, Improved non-pain symptom therapy, Clarified goals of care, Counseled regarding hospice, Provided end of life care assistance, Provided psychosocial or spiritual support, Changed to focus on comfort,  Transitioned to hospice  Patient/Family wishes: Interventions discontinued/not started  Mechanical Ventilation, Antibiotics, Tube feedings/TPN, BiPAP, Hemodialysis, Trach, NIPPV, Transfusion, Vasopressors, PEG, Transfer out of ICU       Patient Active Problem List   Diagnosis Date Noted   Dehydration 03/09/2021   AKI (acute kidney injury) ([place]) 03/09/2021   Accelerated hypertension 03/09/2021   Non Hodgkin's lymphoma ([place]) 03/09/2021   Anxiety 03/09/2021   Aspiration pneumonia ([place]) 03/09/2021   Acute encephalopathy 03/09/2021   Failure to thrive in adult 03/09/2021   Third degree AV block ([place]) 03/08/2021   Moderate dementia with behavioral disturbance 06/18/2016    Palliative Care Assessment & Plan   Patient Profile: 88 y.o. male  with past medical history of non-Hodgkin's lymphoma in remission, advanced dementia, anxiety/depression presented to ED on 03/08/21 from [facility] after an unwitnessed fall. Patient was admitted on 03/08/2021 with third-degree AV block, nonsustained v.tach, right lower lobe bacterial pneumonia, delirium, dehydration, s/p fall.    ED Course: Remained bradycardia heart rate in the 30s.  Afebrile, blood pressure started to 120s, chest x-ray showed right lower lobe pneumonia.   Family arrived and discussed with ED physician, desires no aggressive measures including pacemaker.  Assessment: Third degree AV block Acute encephalopathy AKI Dehydration Aspiration pneumonia Failure to thrive Terminal care  Recommendations/Plan: Continue full comfort measures Continue DNR/DNI as previously documented Transfer to [facility] - approved and bed available today per hospice liaison Continue current comfort focused medication regimen PMT will continue to follow peripherally. If there are any imminent needs please call the service directly  Goals of Care and Additional Recommendations: Limitations on Scope of Treatment: Full Comfort  Care  Code Status:    Code Status Orders  (From admission, onward)           Start     Ordered   03/09/21 1242  Do not attempt resuscitation (DNR)  Continuous       Question Answer Comment  In the event of cardiac or respiratory ARREST Do not call a code blue   In the event of cardiac or  respiratory ARREST Do not perform Intubation, CPR, defibrillation or ACLS   In the event of cardiac or respiratory ARREST Use medication by any route, position, wound care, and other measures to relive pain and suffering. May use oxygen, suction and manual treatment of airway obstruction as needed for comfort.      03/09/21 1244           Code Status History     Date Active Date Inactive Code Status Order ID Comments User Context   03/09/2021 1041 03/09/2021 1244 DNR [id number]  Lavina Hamman, MD ED   03/08/2021 1503 03/09/2021 1041 DNR [id number]  Lequita Halt, MD ED   03/08/2021 1120 03/08/2021 1503 DNR [id number]  Lajean Saver, MD ED   07/02/2016 0019 07/03/2016 1410 Full Code [id number]  [facility]vere[facility] Pace ED      Advance Directive Documentation    Flowsheet Row Most Recent Value  Type of Advance Directive Out of facility DNR (pink MOST or yellow form)  Pre-existing out of facility DNR order (yellow form or pink MOST form) Pink MOST/Yellow Form most recent copy in chart - Physician notified to receive inpatient order  "MOST" Form in Place? --       Prognosis:  < 2 weeks  Discharge Planning: Hospice facility  Care plan was discussed with primary RN, patient's son, attending, TOC, hospice liaison  Thank you for allowing the Palliative Medicine Team to assist in the care of this patient.   Total Time 25 minutes Prolonged Time Billed  no       Greater than 50%  of this time was spent counseling and coordinating care related to the above assessment and plan.  Lin Landsman, NP  Please contact Palliative Medicine Team phone at [phone] for questions and concerns.

## 2021-03-10 NOTE — Progress Notes (Addendum)
Report given to [place]

## 2021-04-09 DEATH — deceased
